# Patient Record
Sex: Female | Born: 1960 | Race: White | Hispanic: No | Marital: Married
Health system: Midwestern US, Community
[De-identification: ages and names within clinical notes are randomized; demographics above are authoritative.]

## PROBLEM LIST (undated history)

## (undated) DIAGNOSIS — R1032 Left lower quadrant pain: Secondary | ICD-10-CM

## (undated) DIAGNOSIS — Z1231 Encounter for screening mammogram for malignant neoplasm of breast: Secondary | ICD-10-CM

## (undated) HISTORY — PX: TOTAL VAGINAL HYSTERECTOMY: SHX2548

## (undated) HISTORY — PX: TONSILLECTOMY: SUR1361

## (undated) HISTORY — PX: VEIN LIGATION AND STRIPPING: SHX2653

## (undated) HISTORY — PX: HERNIA REPAIR: SHX51

---

## 2001-07-18 ENCOUNTER — Other Ambulatory Visit: Admission: RE | Admit: 2001-07-18 | Discharge: 2001-07-18 | Payer: Self-pay | Admitting: Gynecology

## 2002-10-16 ENCOUNTER — Other Ambulatory Visit: Admission: RE | Admit: 2002-10-16 | Discharge: 2002-10-16 | Payer: Self-pay | Admitting: Gynecology

## 2002-11-16 ENCOUNTER — Encounter: Payer: Self-pay | Admitting: Gynecology

## 2002-11-16 ENCOUNTER — Encounter: Admission: RE | Admit: 2002-11-16 | Discharge: 2002-11-16 | Payer: Self-pay | Admitting: Gynecology

## 2002-11-30 ENCOUNTER — Encounter: Admission: RE | Admit: 2002-11-30 | Discharge: 2002-11-30 | Payer: Self-pay | Admitting: Gynecology

## 2002-11-30 ENCOUNTER — Encounter: Payer: Self-pay | Admitting: Gynecology

## 2003-10-30 ENCOUNTER — Encounter: Admission: RE | Admit: 2003-10-30 | Discharge: 2003-10-30 | Payer: Self-pay | Admitting: Gynecology

## 2004-01-01 ENCOUNTER — Other Ambulatory Visit: Admission: RE | Admit: 2004-01-01 | Discharge: 2004-01-01 | Payer: Self-pay | Admitting: Gynecology

## 2004-11-13 ENCOUNTER — Encounter: Admission: RE | Admit: 2004-11-13 | Discharge: 2004-11-13 | Payer: Self-pay | Admitting: Gynecology

## 2005-01-01 ENCOUNTER — Other Ambulatory Visit: Admission: RE | Admit: 2005-01-01 | Discharge: 2005-01-01 | Payer: Self-pay | Admitting: Gynecology

## 2005-12-01 ENCOUNTER — Ambulatory Visit (HOSPITAL_COMMUNITY): Admission: RE | Admit: 2005-12-01 | Discharge: 2005-12-02 | Payer: Self-pay | Admitting: Obstetrics & Gynecology

## 2005-12-01 ENCOUNTER — Encounter (INDEPENDENT_AMBULATORY_CARE_PROVIDER_SITE_OTHER): Payer: Self-pay | Admitting: *Deleted

## 2006-01-07 ENCOUNTER — Encounter: Admission: RE | Admit: 2006-01-07 | Discharge: 2006-01-07 | Payer: Self-pay | Admitting: Obstetrics & Gynecology

## 2006-02-13 ENCOUNTER — Inpatient Hospital Stay (HOSPITAL_COMMUNITY): Admission: AD | Admit: 2006-02-13 | Discharge: 2006-02-13 | Payer: Self-pay | Admitting: Obstetrics and Gynecology

## 2006-02-19 ENCOUNTER — Ambulatory Visit (HOSPITAL_COMMUNITY): Admission: RE | Admit: 2006-02-19 | Discharge: 2006-02-19 | Payer: Self-pay | Admitting: Obstetrics & Gynecology

## 2006-03-25 ENCOUNTER — Ambulatory Visit: Payer: Self-pay | Admitting: Internal Medicine

## 2006-03-25 LAB — CONVERTED CEMR LAB
ALT: 22 units/L (ref 0–40)
AST: 23 units/L (ref 0–37)
Albumin: 4.1 g/dL (ref 3.5–5.2)
Alkaline Phosphatase: 50 units/L (ref 39–117)
BUN: 13 mg/dL (ref 6–23)
Basophils Absolute: 0 10*3/uL (ref 0.0–0.1)
Basophils Relative: 0 % (ref 0.0–1.0)
CO2: 31 meq/L (ref 19–32)
Calcium: 9.5 mg/dL (ref 8.4–10.5)
Chloride: 103 meq/L (ref 96–112)
Chol/HDL Ratio, serum: 2.5
Cholesterol: 183 mg/dL (ref 0–200)
Creatinine, Ser: 1 mg/dL (ref 0.4–1.2)
Eosinophil percent: 1 % (ref 0.0–5.0)
GFR calc non Af Amer: 64 mL/min
Glomerular Filtration Rate, Af Am: 78 mL/min/{1.73_m2}
Glucose, Bld: 99 mg/dL (ref 70–99)
HCT: 39.6 % (ref 36.0–46.0)
HDL: 72.4 mg/dL (ref >39.0)
Hemoglobin: 13.4 g/dL (ref 12.0–15.0)
LDL Cholesterol: 97 mg/dL (ref 0–99)
Lymphocytes Relative: 29.1 % (ref 12.0–46.0)
MCHC: 33.7 g/dL (ref 30.0–36.0)
MCV: 93.7 fL (ref 78.0–100.0)
Monocytes Absolute: 0.5 10*3/uL (ref 0.2–0.7)
Monocytes Relative: 9.6 % (ref 3.0–11.0)
Neutro Abs: 3 10*3/uL (ref 1.4–7.7)
Neutrophils Relative %: 60.3 % (ref 43.0–77.0)
Platelets: 185 10*3/uL (ref 150–400)
Potassium: 4.3 meq/L (ref 3.5–5.1)
RBC: 4.23 M/uL (ref 3.87–5.11)
RDW: 12 % (ref 11.5–14.6)
Sodium: 142 meq/L (ref 135–145)
TSH: 0.98 microintl units/mL (ref 0.35–5.50)
Total Bilirubin: 1.1 mg/dL (ref 0.3–1.2)
Total Protein: 7.5 g/dL (ref 6.0–8.3)
Triglyceride fasting, serum: 66 mg/dL (ref 0–149)
VLDL: 13 mg/dL (ref 0–40)
WBC: 4.9 10*3/uL (ref 4.5–10.5)

## 2006-04-01 ENCOUNTER — Ambulatory Visit: Payer: Self-pay | Admitting: Internal Medicine

## 2006-04-06 ENCOUNTER — Encounter: Admission: RE | Admit: 2006-04-06 | Discharge: 2006-04-06 | Payer: Self-pay | Admitting: Obstetrics & Gynecology

## 2006-05-19 ENCOUNTER — Ambulatory Visit: Payer: Self-pay | Admitting: Internal Medicine

## 2007-02-07 ENCOUNTER — Encounter: Admission: RE | Admit: 2007-02-07 | Discharge: 2007-02-07 | Payer: Self-pay | Admitting: Obstetrics & Gynecology

## 2007-03-17 LAB — CONVERTED CEMR LAB: Pap Smear: NORMAL

## 2007-06-01 ENCOUNTER — Encounter: Payer: Self-pay | Admitting: Internal Medicine

## 2007-12-09 ENCOUNTER — Telehealth: Payer: Self-pay | Admitting: Internal Medicine

## 2007-12-26 ENCOUNTER — Ambulatory Visit: Payer: Self-pay | Admitting: Internal Medicine

## 2007-12-26 LAB — CONVERTED CEMR LAB
ALT: 13 units/L (ref 0–35)
AST: 21 units/L (ref 0–37)
Albumin: 4 g/dL (ref 3.5–5.2)
Alkaline Phosphatase: 51 units/L (ref 39–117)
BUN: 15 mg/dL (ref 6–23)
Bilirubin, Direct: 0.1 mg/dL (ref 0.0–0.3)
CO2: 29 meq/L (ref 19–32)
Eosinophils Relative: 1.4 % (ref 0.0–5.0)
GFR calc Af Amer: 87 mL/min
Glucose, Bld: 97 mg/dL (ref 70–99)
HCT: 39.8 % (ref 36.0–46.0)
HDL: 67.8 mg/dL (ref >39.0)
Hemoglobin: 14.1 g/dL (ref 12.0–15.0)
LDL Cholesterol: 112 mg/dL — ABNORMAL HIGH (ref 0–99)
Lymphocytes Relative: 35.8 % (ref 12.0–46.0)
Monocytes Absolute: 0.5 10*3/uL (ref 0.1–1.0)
Monocytes Relative: 10.6 % (ref 3.0–12.0)
Nitrite: NEGATIVE
Platelets: 173 10*3/uL (ref 150–400)
Potassium: 4.5 meq/L (ref 3.5–5.1)
Sodium: 144 meq/L (ref 135–145)
Specific Gravity, Urine: >=1.03
Total CHOL/HDL Ratio: 2.8
Total Protein: 7.4 g/dL (ref 6.0–8.3)
Urobilinogen, UA: 0.2
WBC Urine, dipstick: NEGATIVE
WBC: 4.8 10*3/uL (ref 4.5–10.5)

## 2008-01-18 ENCOUNTER — Ambulatory Visit: Payer: Self-pay | Admitting: Internal Medicine

## 2008-01-18 DIAGNOSIS — Z780 Asymptomatic menopausal state: Secondary | ICD-10-CM | POA: Insufficient documentation

## 2008-02-28 ENCOUNTER — Encounter: Admission: RE | Admit: 2008-02-28 | Discharge: 2008-02-28 | Payer: Self-pay | Admitting: Obstetrics & Gynecology

## 2008-07-12 IMAGING — CT CT ABDOMEN W/ CM
2 of 5 series · 17 of 46 positions shown, 19 images · IV contrast (READICAT/WATER & [ID] OMNI 300)
Comparison: none

CLINICAL DATA: Pain primarily in the pelvis. 
 CT ABDOMEN AND PELVIS WITH CONTRAST:
TECHNIQUE: Multidetector CT imaging of the abdomen and pelvis was performed following the standard protocol during bolus administration of intravenous contrast.
 Contrast:  833cc Omnipaque 300.
 CT ABDOMEN WITH CONTRAST:

[Series 3: routine abdomen · axial · 0.70mm/px · z∈[-396,-51]mm · 14 of 77 slices shown, 16 images]
[im 5/77  soft-tissue]
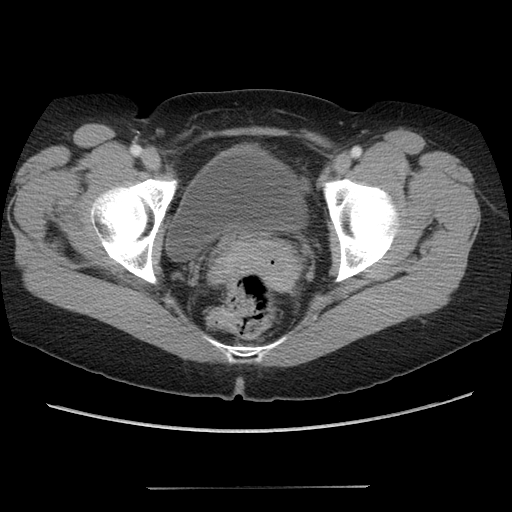
[im 5/77  bone]
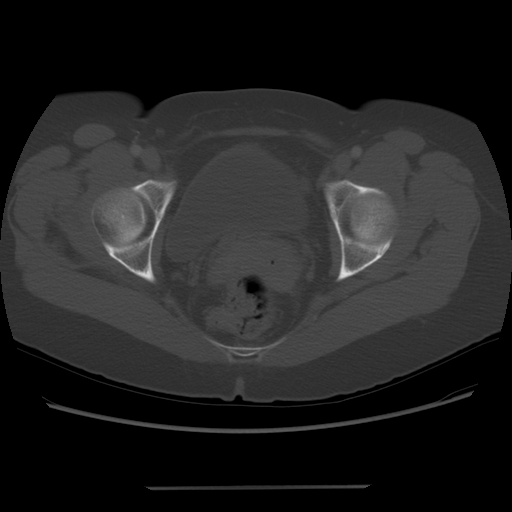
[im 9/77  soft-tissue]
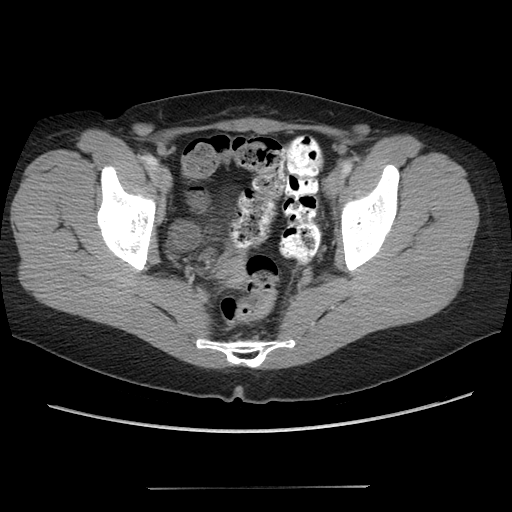
[im 17/77  soft-tissue]
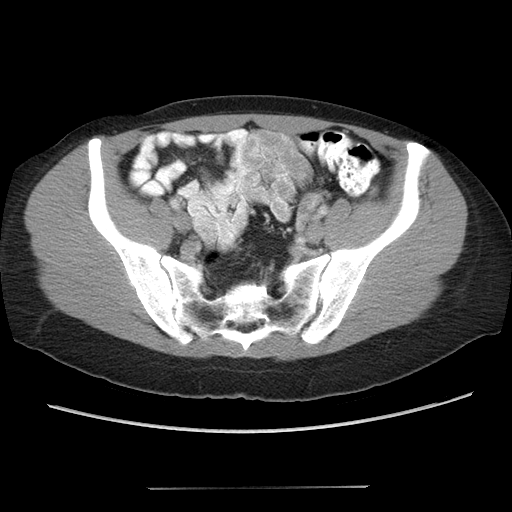
[im 21/77  soft-tissue]
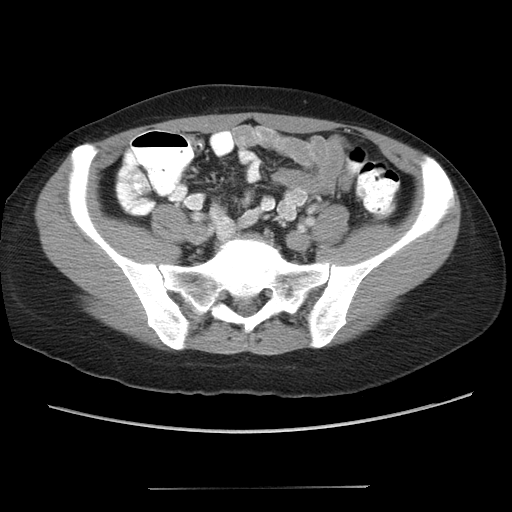
[im 25/77  soft-tissue]
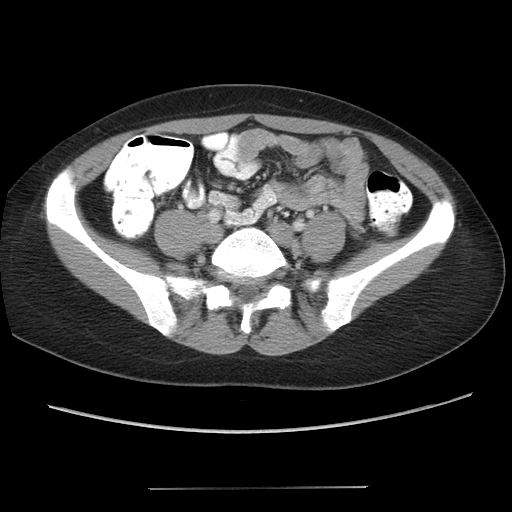
[im 33/77  soft-tissue]
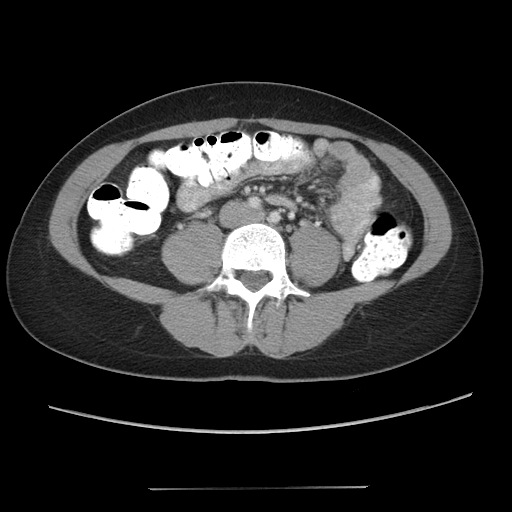
[im 37/77  soft-tissue]
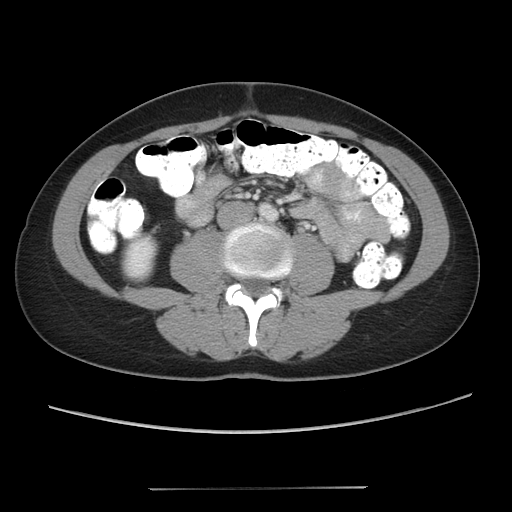
[im 41/77  soft-tissue]
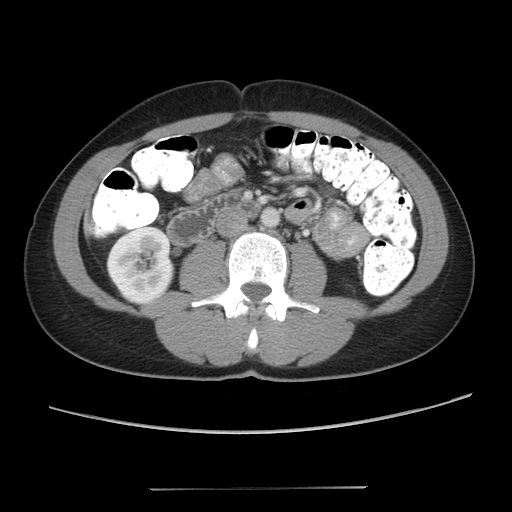
[im 45/77  soft-tissue]
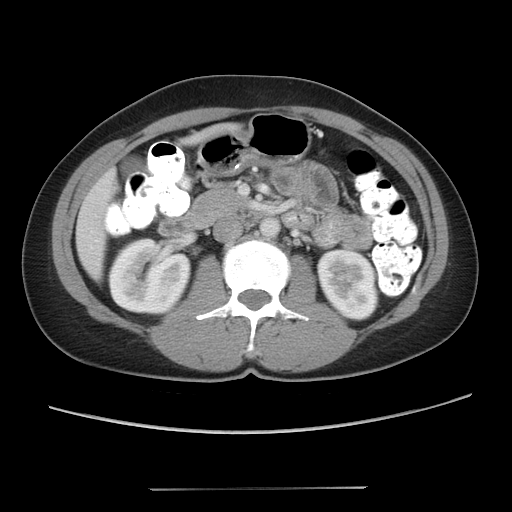
[im 45/77  bone]
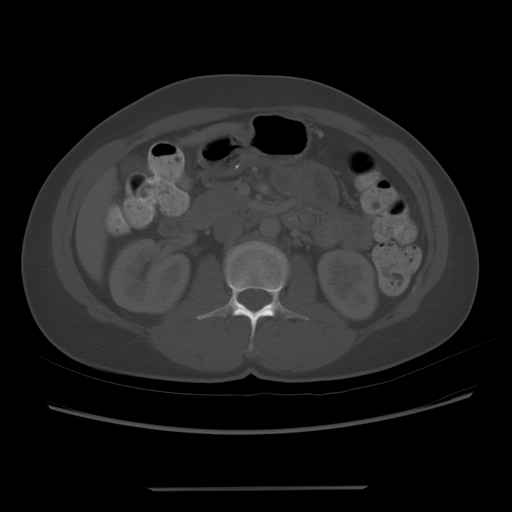
[im 53/77  soft-tissue]
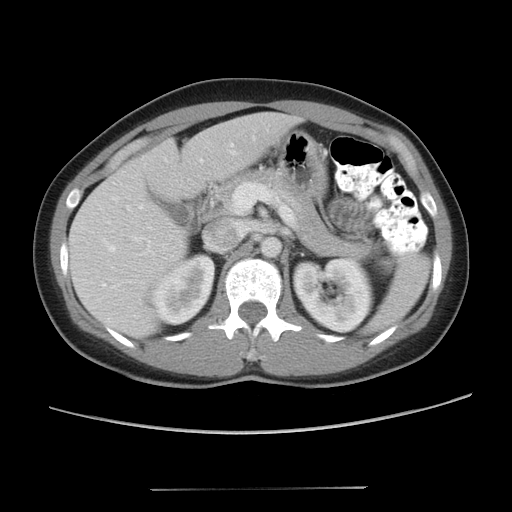
[im 57/77  soft-tissue]
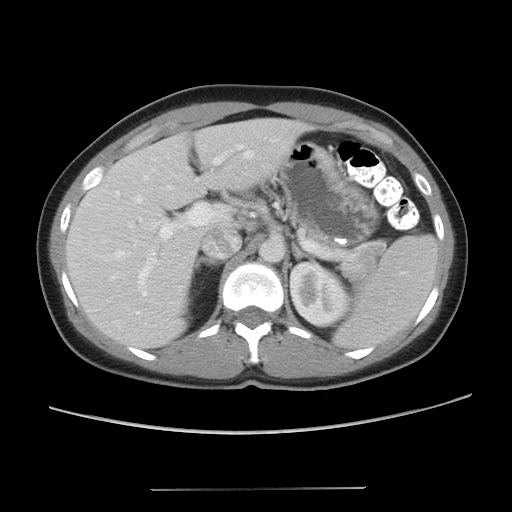
[im 61/77  soft-tissue]
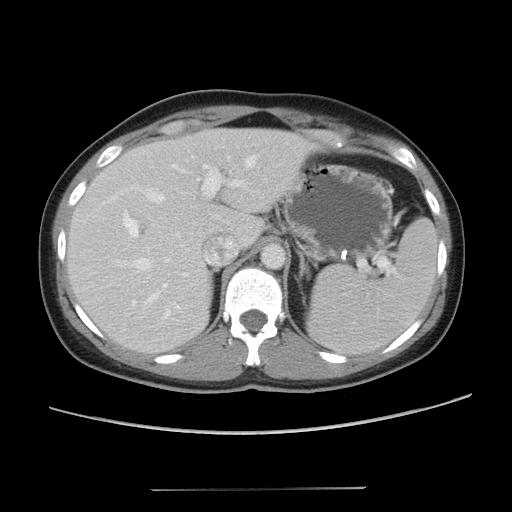
[im 69/77  soft-tissue]
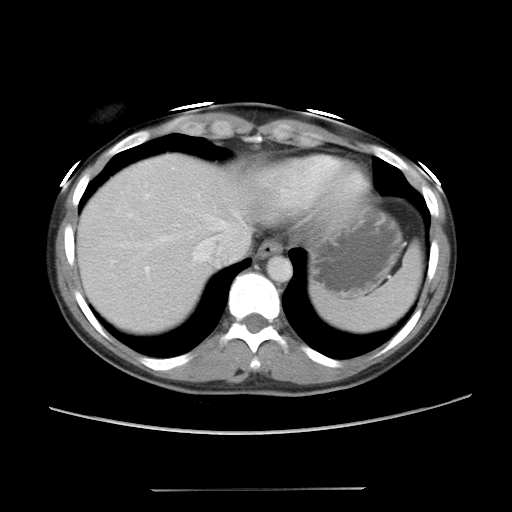
[im 73/77  soft-tissue]
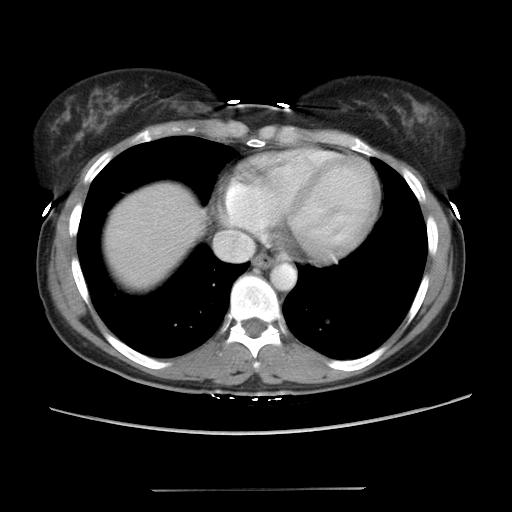

[Series 602: sagittal body · sagittal · 0.87mm/px · 3 of 145 slices shown]
[im 49/145  soft-tissue]
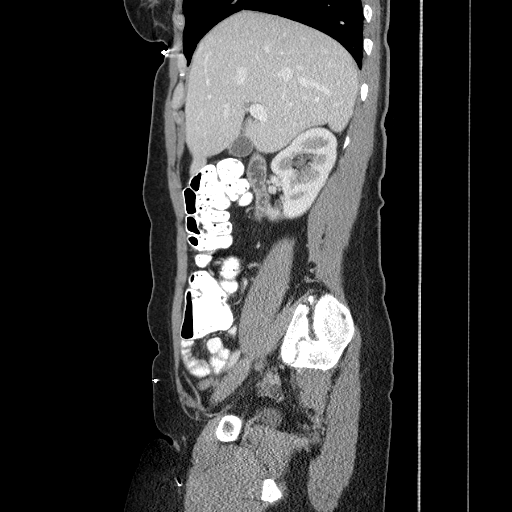
[im 65/145  soft-tissue]
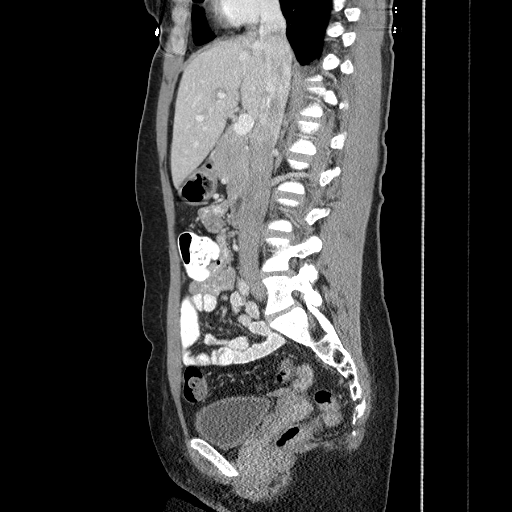
[im 81/145  soft-tissue]
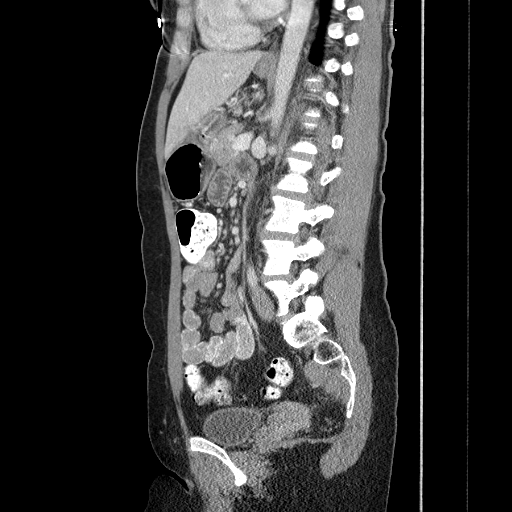

[17 of 46 positions shown; findings below may reference images not displayed]

FINDINGS: The lung bases are clear.  The liver enhances with no focal abnormality and no ductal dilatation is seen.  No calcified gallstones are noted.  The pancreas appears normal as are the adrenal glands and the spleen.  The kidneys enhance and on delayed images the pelvocaliceal systems appear normal.  The abdominal aorta is normal in caliber.
IMPRESSION: Negative CT of the abdomen. 
 CT PELVIS WITH CONTRAST:
FINDINGS: The appendix is well seen and appears normal as does the terminal ileum.  The urinary bladder is not well distended but no gross abnormality is seen.  No pelvic mass is seen and no fluid is noted within the pelvis.  Low attenuation area in the left adnexa is most consistent with a left ovarian cyst of approximately 32 x 23mm.  Small right ovarian follicles are present.  There are a few minimally prominent groin nodes present.  Surgical clips are present in the right groin.
IMPRESSION: 1.  No acute abnormality on CT of the pelvis.  Bilateral ovarian cysts are noted left slightly larger than right. 
 2.  Appendix and terminal ileum appear normal. 
 3.  Minimally prominent groin nodes.

## 2009-03-04 ENCOUNTER — Encounter: Admission: RE | Admit: 2009-03-04 | Discharge: 2009-03-04 | Payer: Self-pay | Admitting: Obstetrics & Gynecology

## 2010-08-01 NOTE — Discharge Summary (Signed)
Melinda Bradshaw, Melinda Bradshaw               ACCOUNT NO.:  [id number]   MEDICAL RECORD NO.:  [id number]          PATIENT TYPE:  OIB   LOCATION:  9302                          FACILITY:  [facility]   PHYSICIAN:  M. Leda Quail, MD  DATE OF BIRTH:  01-29-1961   DATE OF ADMISSION:  12/01/2005  DATE OF DISCHARGE:  12/02/2005                                 DISCHARGE SUMMARY   ADMISSION DIAGNOSES:  10. A 50 year old G4, P2 married white female with fibroid uterus.  2. Spontaneous vaginal delivery x2.  3. Pelvic pressure and bladder pressure.   DISCHARGE DIAGNOSES:  66. A 50 year old G3, P2 married white female with fibroid uterus.  2. Spontaneous vaginal delivery x2.  3. Pelvic pressure and bladder pressure.   PROCEDURES:  Laparoscopic-assisted vaginal hysterectomy.   HOSPITAL COURSE:  Written H&P is in the chart.  [name] is a very  pleasant 50 year old G66, P2 married white female with symptomatic fibroid  uterus who has opted for definitive treatment with LAVH who is admitted to  same day surgery and taken to the respiratory rate where the surgery was  performed without difficulty.  She has 50 cc of blood loss.  She made 100 cc  of clear urine during the procedure.  After appropriate time in the recovery  room, she was taken to the third floor for postop course.  In the evening of  postoperative day #0, she was doing well, vital signs were stable and she  was afebrile.  Her Foley catheter was removed and she was able to void  without difficulty.  She had regular diet in the evening of postoperative  day #0.  In the morning of postop day #1, she was able to advance to a  regular diet.  She was switched to oral pain medications and did well with  Percocet.  She was able to ambulate and shower without difficulty.  Her  postop hemoglobin was 11.8, white count 8.2, platelets 173.  She was doing  well.  Discharge was felt appropriate.   Pathology showed mild chronic cervicitis, adenomyosis, benign  leiomas with  largest measuring 5 cm, benign proliferative endometrium.      Lum Keas, MD  Electronically Signed     MSM/MEDQ  D:  12/25/2005  T:  12/28/2005  Job:  [id number]

## 2010-08-01 NOTE — Op Note (Signed)
NAMEREMA, LIEVANOS               ACCOUNT NO.:  [id number]   MEDICAL RECORD NO.:  [id number]          PATIENT TYPE:  OIB   LOCATION:  9302                          FACILITY:  [facility]   PHYSICIAN:  M. Leda Quail, MD  DATE OF BIRTH:  [birth year]   DATE OF PROCEDURE:  12/01/2005  DATE OF DISCHARGE:                                 OPERATIVE REPORT   PREOPERATIVE DIAGNOSIS:  1. A 50 year old G1, P2 married white female seen today for fibroid      uterus.  2. History of NSVD x2.   POSTOPERATIVE DIAGNOSIS:  1. A 50 year old G12, P2 married white female seen today for fibroid      uterus.  2. History of NSVD x2.   PROCEDURES:  LAVH.   SURGEON:  M. Leda Quail, MD.   ASSISTANT:  Meredeth Ide, M.D. .   ANESTHESIA:  General endotracheal.   SPECIMENS:  Uterus and cervix sent to pathology.   ESTIMATED BLOOD LOSS:  50 cc.   URINE OUTPUT:  100 cc of clear urine during, at the beginning and at the end  of the procedure.   FLUIDS:  2200 cc of LR.   COMPLICATIONS:  None.   INDICATIONS:  This is a 50 year old married white female with a history of  an enlarged uterus approximately 12 weeks in size.  She has had multiple  fibroids on ultrasound.  She has a significant amount of pain and  discomfort, even though she does have relatively good bleeding control while  using a NuvaRing.  She has considered options and is desires definitive  treatment.   OPERATIVE PROCEDURE:  The patient was taken to the operating room.  She was  placed in supine position.  She underwent endotracheal anesthesia  administered by Anesthesia staff.  Running IV is in place and informed  consent was present on the chart.  The patient's legs are positioned in the  [place] stirrups in the lower body position.  Abdomen, peroneum, inner thighs  and vagina were prepped in normal sterile fashion.  Foley catheter was  inserted and the bladder was drained of all urine.  Catheterization was then  removed.  Speculum  was placed in the vagina, and the cervix was well  visualized.  A single toothed tenaculum was placed on the anterior lip of  the cervix and an [place] uterine manipulator was placed in the cervical os as  a means to manipulate the uterus during the procedure.  The speculum was  removed from the vagina, and the abdomen and peroneum were draped in a  normal sterile fashion.   A 10 mm skin incision was made with a knife beneath the umbilicus.  Subcutaneous fat tissue was dissected.  The abdominal wall was elevated, and  using Veress needles aiming towards the pelvis, the abdominal wall areas  were traversed.  The peritoneum was popped through.  Syringe and normal  saline was used to aspirate, inject and then aspirate again.  No bleeding or  other fluid was noted.  A drip test showed fluid dripping equally into the  abdomen.  A CO2 gas  was attached to the Veress needle at low flow.  Pressures were low and the pneumoperitoneum was achieved without difficulty.  Once 2-1/2 liters of CO2 gas were in the abdomen, Veress needle was removed.  A 5 mm bladed trocar port were placed in the midline with elevation of the  abdomen.  This was aimed towards the pelvis again, and the abdominal wall  areas were traversed easily.  A 5 mm scope was used to survey the pelvis and  upper abdomen.  Photic documentation was made.  The ovaries appeared normal  bilaterally.  There was a small adhesion on the right ovary to the pelvic  sidewall.  There are multiple fibroids present, particularly over on the  left side of the uterus where there is a large fibroid that is pressing onto  the sidewall.  The ureters were noted bilaterally.  The upper abdomen was  surveyed.  The liver edge, gallbladder and stomach edge appeared normal.  The appendix was searched for, but we could not find it.   The right and left lower quadrants were transilluminated and sights for  right and left lower quadrant ports were chosen.  Marcaine  40% was used to  inject the skin, and then 5 mm skin incisions were made with the knife.  The  5 mm bladed trocars were placed under direct visualization intra-  abdominally.  These were placed within difficulty.   Then, using endoscopic grasper, the right corner of the uterus was grasped.  A Gyrus was used to cauterize and then traverse uterine ovarian pedicle on  the right.  Sound indicator was used to insure that excellent hemostasis of  the pedicle was achieved.  The uterus was put on stretch to take the Gyrus  as far away from the pelvic sidewall as possible.  On the right side of the  uterine, ovarian ligament was cauterized and traversed, and a round ligament  on the right was cauterized and traversed in a similar fashion.  The  beginning of the broad ligament on the right side was also cauterized and  incised.  Then in a similar fashion on the left side of the uterus,  endoscopic grasper was used to put the uterus on stretch.  The round  ligament was cauterized and incised on the left side.  A uterine ovarian  pedicle was cauterized serially and sized again, keeping the Gyrus as far  away from the pelvic sidewall as possible.  The ureter was noted several  times to be peristaltic.  It's location was far below the level of  dissection.  At this point, decision was made to go ahead below with the  procedure.  Legs were positioned in the high lithotomy position.   A weighted speculum was placed in the vagina.  Posterior cul-de-sac was  entered sharply.  A figure-of-8 suture was placed across the posterior  peritoneum and the posterior vaginal mucosa.  The cervix was then  circumscribed with cautery.  The uterosacral ligaments bilaterally were  clamped, transected and sutured with 8 sutures of 0-Vicryl.  Attention was  then turned anteriorly on the uterus and tubo-vesicocervical fascia was  incised with curved Mayo scissors.  Using blunt dissection, bladder flap was created, pushing  the bladder anteriorly, superiorly and out of the way of  surgical dissection.  The point at cardinal ligaments were serially clamped,  transected and suture ligated with 0-Vicryl.  Further dissection of the tubo-  vesicocervical fascia was performed as necessary.  The uterine arteries  were  clamped bilaterally, transected and suture ligated with stitch of 0-Vicryl.  The uterine and artery pedicles were tied twice with stitches of 0-Vicryl  with excellent hemostasis.  At this point, the anterior peritoneum was  identified and was tied sharply.  A curved Avery retractor was placed into  the incision, and bladder was elevated.  At this point, two more clamps of  the broad ligament on the right and left side were placed, transected and  suture ligated with 0-Vicryl.  At this point, the posterior aspect of the  fundus was brought into the vagina.  There were several fibroids that were  present.  The uterus was morcellated and brought out in pieces because of  the large fibroids.  The fibroids were excised as well.  Once the uterus  could be completely flipped, a small amount of tissue remained and Haney  clamps were placed across this tissue bilaterally.  These pedicles were  transected and suture ligated with three times 0-Vicryl.  These sutures were  cut short.  There was a small amount of bleeding on the left side in the  region of the broad ligament.  Allis clamp was placed across the region of  the bleeding and a superficial figure-of-8 stitch and 0-Vicryl was placed.  Excellent hemostasis was present at this point.  Cuff was surveyed, no  bleeding was noted, and a moist laparotomy tape was placed in the incision,  pushing bowel out of the way.  At this point, the cuff was run with a  pledget 0-Vicryl.  Starting at 2 o'clock and incorporating the anterior  peritoneum, a running interlocking stitch was brought around the vaginal  cuff, incorporating the anterior peritoneum and the posterior  peritoneum in  the stitch.  The stitch ended at approximately 10 o'clock on the cervix.  At  this point, excellent hemostasis present.  A uterosacral ligament stitch was  placed and brought the posterior vaginal mucosa and incorporating the medial  third of the left uterosacral ligament, reaching over the posterior vaginal  peritoneum and then incorporating the medial third of the right uterosacral  ligament.  The stitches had been brought out of the posterior vaginal mucosa  and the stitch was tied tight.  The vagina was lifted very nicely.  At this  point, three figure-of-8 sutures of 2-0 Vicryl Rapide were placed to close  vaginal cuff.  A Foley catheter was placed in the bladder.  The legs were  then placed in the low lithotomy position.  Attention was turned back to the  abdomen.  The pneumoperitoneum was achieved again, and CO2 gas and  laparoscope used to survey the vaginal cuff.  Excellent hemostasis along the vaginal cuff.  None of the ureters were noted bilaterally with normal  peristalsis.  There was some small mild bleeding at the left IP ligament.  Using the Gyrus, this was made hemostatic.  Again, attempt was made to find  the appendix which was unsuccessful.  At this point, all pedicles were  hemostatic.  The pneumoperitoneum was relieved.  The left lower quadrant  ports were removed under direct visualization of the laparoscope and no  bleeding was noted.  Once the midline port and laparoscope were removed.  The operator's index finger was used to insure there was no bowel in the  incision.  The anesthesiologist gave several breaths of oxygen and the  pneumoperitoneum was relieved by the operating assistant pressing on the  abdomen to allow for as much as to  escape as possible.   The incisions were cleansed.  Dermabond was applied to each incision x ii.  Dressings were placed across the incisions.   Sponge, needle and instrument counts were correct x2.  The patient  tolerated  the procedure very well and she was taken to the recovery room in stable  condition.      Lum Keas, MD  Electronically Signed     MSM/MEDQ  D:  12/01/2005  T:  12/02/2005  Job:  [id number]

## 2011-12-01 ENCOUNTER — Other Ambulatory Visit: Payer: Self-pay | Admitting: Internal Medicine

## 2011-12-01 DIAGNOSIS — R1905 Periumbilic swelling, mass or lump: Secondary | ICD-10-CM

## 2011-12-04 ENCOUNTER — Ambulatory Visit
Admission: RE | Admit: 2011-12-04 | Discharge: 2011-12-04 | Disposition: A | Discharge status: Home / Self Care | Payer: BC Managed Care – PPO | Source: Ambulatory Visit | Attending: Internal Medicine | Admitting: Internal Medicine

## 2011-12-04 DIAGNOSIS — R1905 Periumbilic swelling, mass or lump: Secondary | ICD-10-CM

## 2012-05-28 ENCOUNTER — Emergency Department (HOSPITAL_COMMUNITY)
Admission: EM | Admit: 2012-05-28 | Discharge: 2012-05-29 | Disposition: A | Discharge status: Home / Self Care | Payer: BC Managed Care – PPO | Attending: Emergency Medicine | Admitting: Emergency Medicine

## 2012-05-28 DIAGNOSIS — M7022 Olecranon bursitis, left elbow: Secondary | ICD-10-CM

## 2012-05-28 DIAGNOSIS — M25439 Effusion, unspecified wrist: Secondary | ICD-10-CM | POA: Insufficient documentation

## 2012-05-28 DIAGNOSIS — M7020 Olecranon bursitis, unspecified elbow: Secondary | ICD-10-CM | POA: Insufficient documentation

## 2012-05-29 ENCOUNTER — Encounter (HOSPITAL_COMMUNITY): Payer: Self-pay

## 2012-05-29 MED ORDER — IBUPROFEN 800 MG PO TABS: 800.0000 mg | ORAL_TABLET | Freq: Three times a day (TID) | ORAL | Status: AC

## 2012-05-29 NOTE — ED Notes (Signed)
Per pt, suddenly today noted pain in left elbow.  Pt with swelling and redness to left elbow.  Attempted ice at home.  No pain meds at home.

## 2012-05-29 NOTE — ED Provider Notes (Signed)
History    This chart was scribed for non-physician practitioner working with [name], MD by Frederik Pear, ED Scribe. This patient was seen in room WTR7/WTR7 and the patient's care was started at 2355.   CSN: [id number]  Arrival date & time 05/28/12  2346   First MD Initiated Contact with Patient 05/28/12 2355      Chief Complaint  Patient presents with  . Elbow Pain    (Consider location/radiation/quality/duration/timing/severity/associated sxs/prior treatment) Patient is a 52 y.o. female presenting with extremity pain. The history is provided by the patient. No language interpreter was used.  Extremity Pain This is a new problem. The current episode started 6 to 12 hours ago. The problem occurs constantly. The problem has been gradually worsening. She has tried a cold compress for the symptoms.   Melinda Bradshaw is a 52 y.o. female who presents to the Emergency Department complaining of sudden onset, 9/10, constant, gradually worsening left elbow pain with associated swelling and redness that began at 1500. She denies any trauma to the area. She states that she spends a lot of time typing at her desk, which is L-shaped, and her left arm is often placed on her desk while she is typing. She denies having a wrist guard on the desk. She denies any fever or chills. Denies numbness or tingling.  She states that she is right handed. She reports that she treated the symptoms with ice at home.  History reviewed. No pertinent past medical history.  Past Surgical History  Procedure Laterality Date  . Hernia repair    . Total vaginal hysterectomy    . Tonsillectomy    . Vein ligation and stripping      History reviewed. No pertinent family history.  History  Substance Use Topics  . Smoking status: Never Smoker   . Smokeless tobacco: Not on file  . Alcohol Use: No    OB History   Grav Para Term Preterm Abortions TAB SAB Ect Mult Living                  Review of Systems   Musculoskeletal: Positive for joint swelling and arthralgias.  All other systems reviewed and are negative.    Allergies  Review of patient's allergies indicates not on file.  Home Medications  No current outpatient prescriptions on file.  BP 135/73  Pulse 75  Temp(Src) 98.9 F (37.2 C)  Resp 18  SpO2 100%  Physical Exam  Nursing note and vitals reviewed. Constitutional: She appears well-developed and well-nourished.  HENT:  Head: Normocephalic and atraumatic.  Mouth/Throat: Oropharynx is clear and moist.  Eyes: EOM are normal. Pupils are equal, round, and reactive to light.  Neck: Normal range of motion. Neck supple.  Cardiovascular: Normal rate, regular rhythm and normal heart sounds.   Good radial pulses.  Pulmonary/Chest: Effort normal and breath sounds normal. She has no wheezes.  Musculoskeletal: Normal range of motion. She exhibits tenderness.  Mild erythema and swelling over the are of the olecreon bursa. There is good ROM of the elbow. ROM of the wrist and shoulder are intact. Full extension of the elbow, but flexion is mildly limited secondary to pain.  Neurological: She is alert.  Sensation is intact.   Skin: Skin is warm and dry.  Psychiatric: She has a normal mood and affect. Her behavior is normal.    ED Course  Procedures (including critical care time)  DIAGNOSTIC STUDIES: Oxygen Saturation is 100% on room air, normal by my  interpretation.    COORDINATION OF CARE:  00:15- Discussed planned course of treatment with the patient, including treating the symptoms with antiinflammatories, who is agreeable at this time.  Labs Reviewed - No data to display No results found.   No diagnosis found.    MDM  Physical exam consistent with Olecranon Bursitis.  Patient afebrile with full ROM of the elbow.  Therefore, doubt septic joint.  Patient instructed to use NSAIDs.  I personally performed the services described in this documentation, which was scribed  in my presence. The recorded information has been reviewed and is accurate.        Pascal Lux [place], PA-C 05/29/12 [phone]

## 2012-05-30 NOTE — ED Provider Notes (Signed)
Medical screening examination/treatment/procedure(s) were conducted as a shared visit with non-physician practitioner(s) and myself.  I personally evaluated the patient during the encounter  [name], MD 05/30/12 [phone]

## 2013-03-31 ENCOUNTER — Other Ambulatory Visit: Payer: Self-pay

## 2013-03-31 DIAGNOSIS — Z1231 Encounter for screening mammogram for malignant neoplasm of breast: Secondary | ICD-10-CM

## 2013-04-07 ENCOUNTER — Ambulatory Visit
Admission: RE | Admit: 2013-04-07 | Discharge: 2013-04-07 | Disposition: A | Discharge status: Home / Self Care | Payer: No Typology Code available for payment source | Source: Ambulatory Visit

## 2013-04-07 DIAGNOSIS — Z1231 Encounter for screening mammogram for malignant neoplasm of breast: Secondary | ICD-10-CM

## 2013-04-13 ENCOUNTER — Other Ambulatory Visit: Payer: Self-pay | Admitting: Obstetrics & Gynecology

## 2013-04-13 DIAGNOSIS — R928 Other abnormal and inconclusive findings on diagnostic imaging of breast: Secondary | ICD-10-CM

## 2013-04-19 ENCOUNTER — Ambulatory Visit
Admission: RE | Admit: 2013-04-19 | Discharge: 2013-04-19 | Disposition: A | Discharge status: Home / Self Care | Payer: No Typology Code available for payment source | Source: Ambulatory Visit | Attending: Obstetrics & Gynecology | Admitting: Obstetrics & Gynecology

## 2013-04-19 DIAGNOSIS — R928 Other abnormal and inconclusive findings on diagnostic imaging of breast: Secondary | ICD-10-CM

## 2014-03-11 IMAGING — US US TRANSVAGINAL NON-OB
1 series · 12 of 12 positions shown · non-contrast
Comparison: CT abdomen pelvis of 04/06/2006

CLINICAL DATA: Abdominal and pelvic fullness and swelling, prior
hysterectomy

TRANSABDOMINAL AND TRANSVAGINAL ULTRASOUND OF PELVIS
TECHNIQUE: Both transabdominal and transvaginal ultrasound
examinations of the pelvis were performed including evaluation of
the uterus, ovaries, adnexal regions, and pelvic cul-de-sac.

[Series 1: us transvaginal non-ob · 12 acquisitions, 12 frames shown]
[im 1/12]
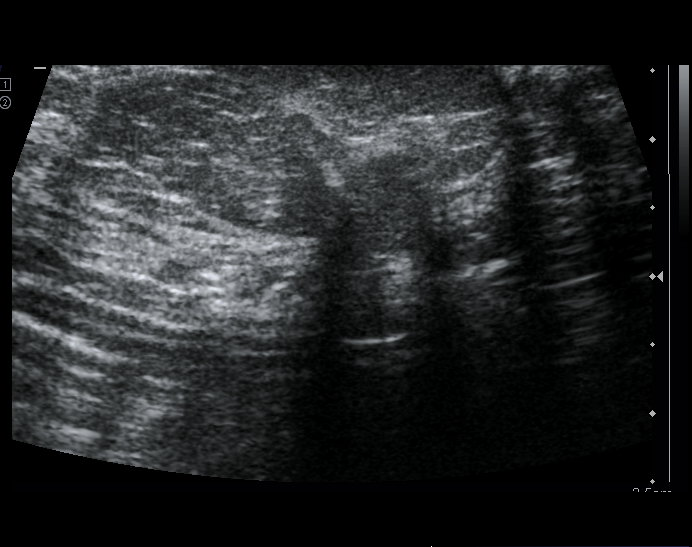
[im 2/12]
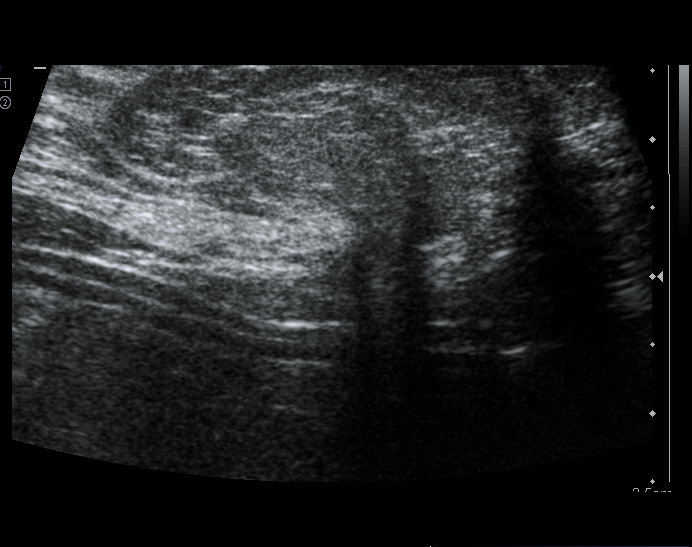
[im 3/12]
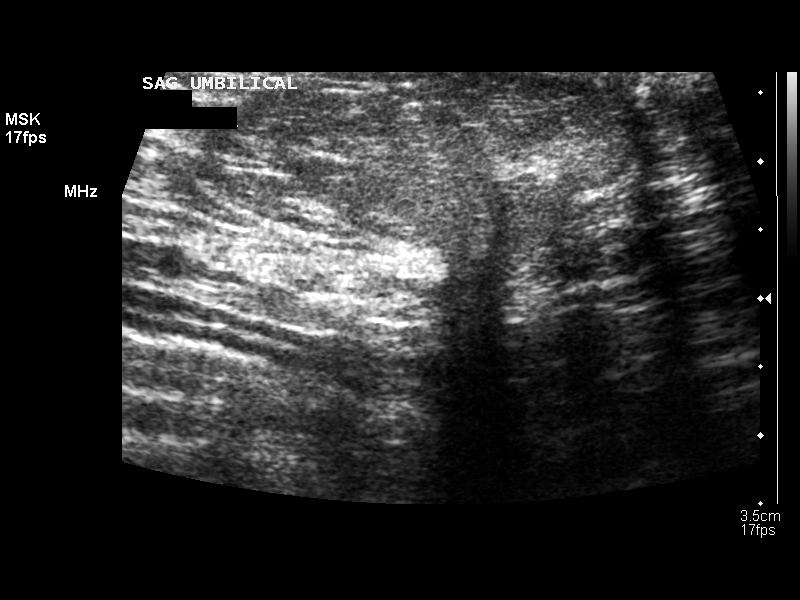
[im 4/12]
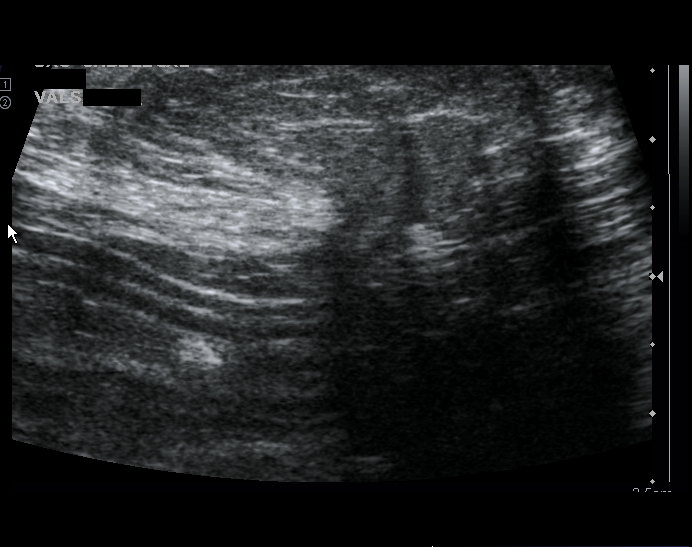
[im 5/12]
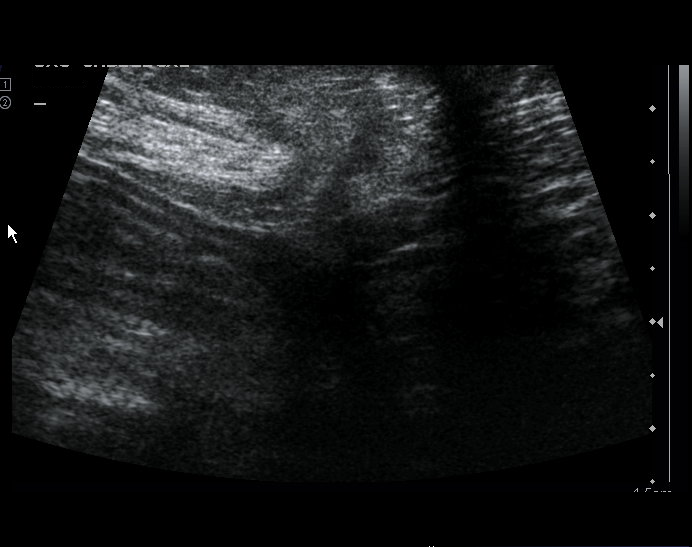
[im 6/12]
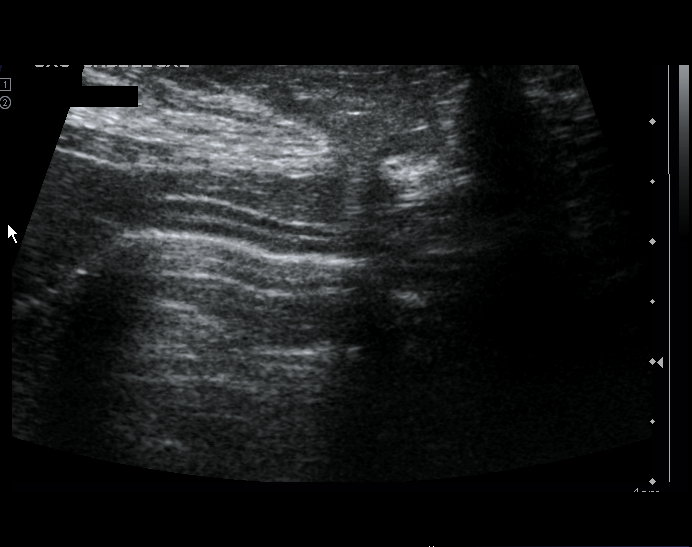
[im 7/12]
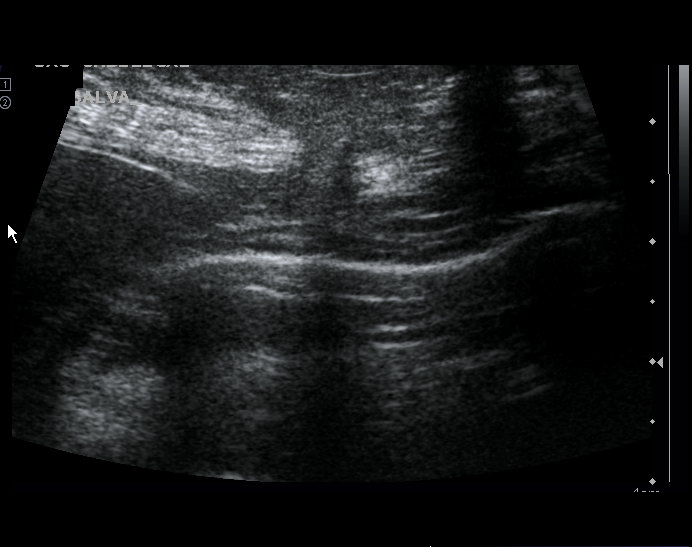
[im 8/12]
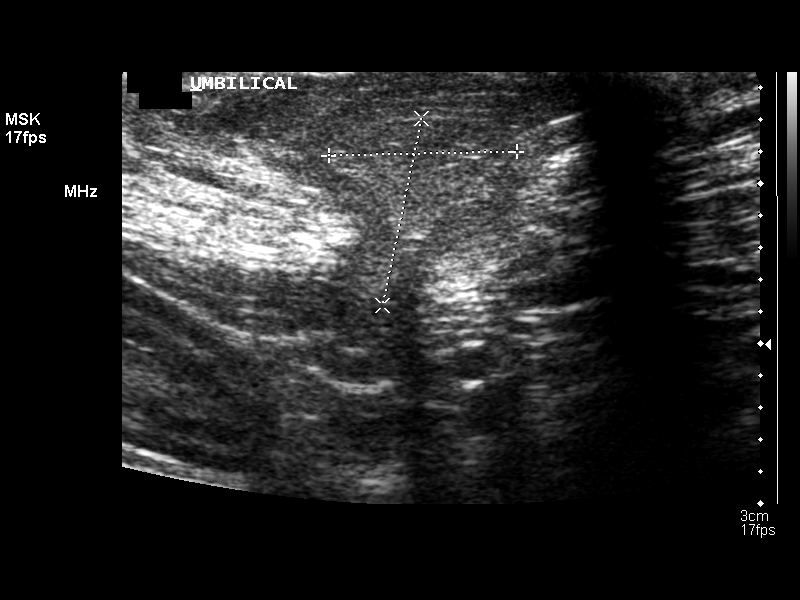
[im 9/12]
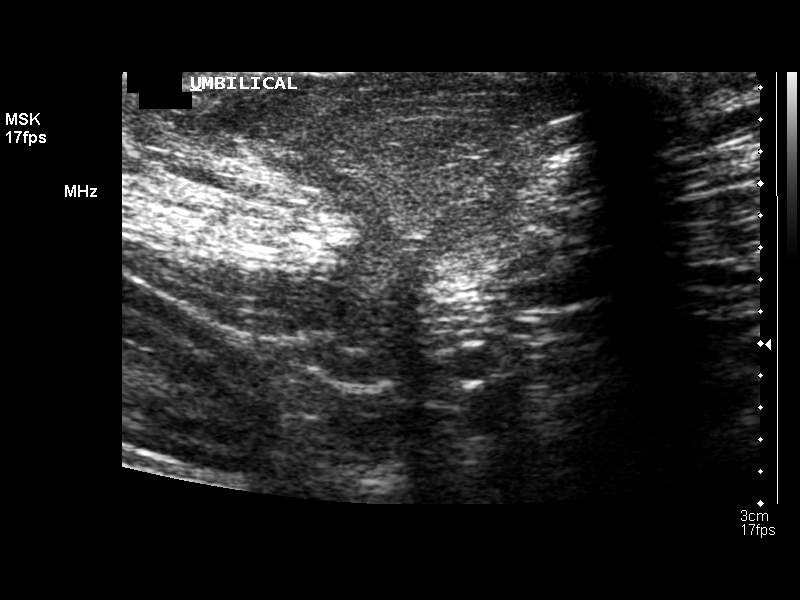
[im 10/12]
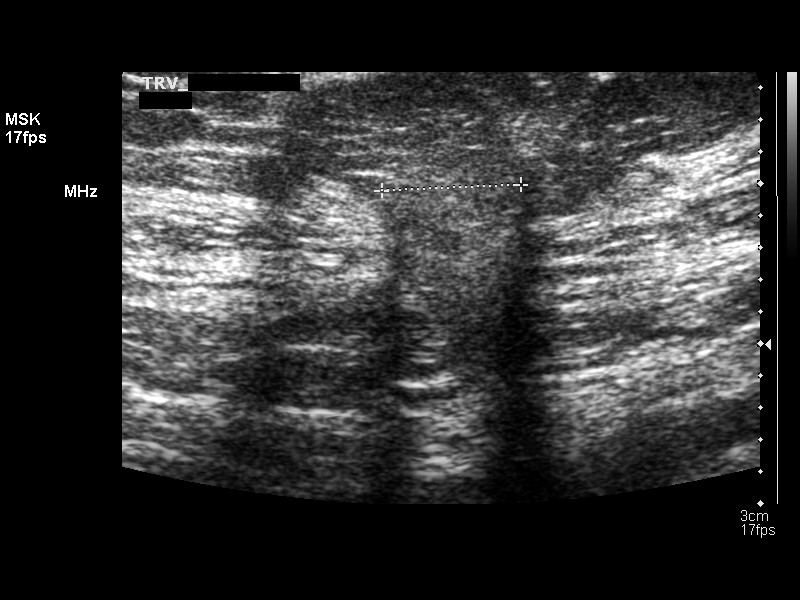
[im 11/12]
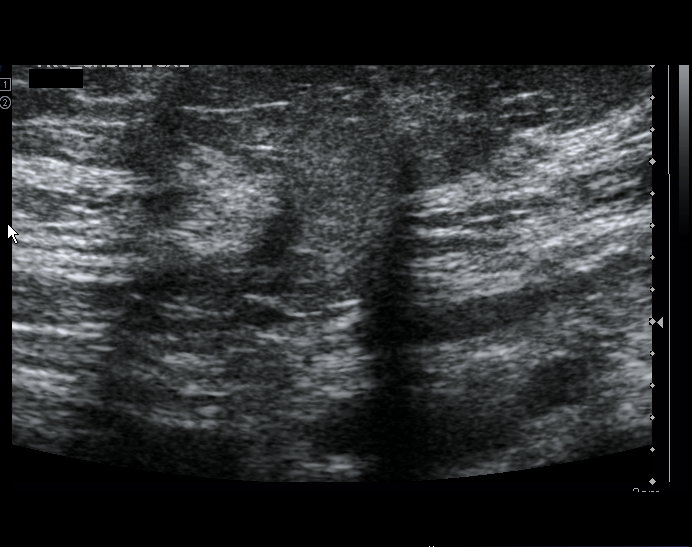
[im 12/12]
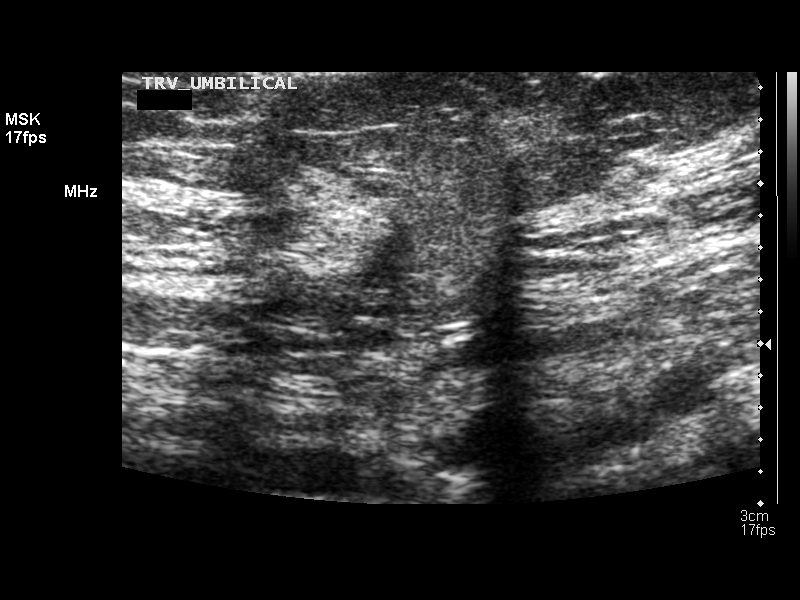

[12 of 12 positions shown; findings below may reference images not displayed]

FINDINGS: Uterus: The uterus has previously been resected.

Endometrium:Not applicable.

Right Ovary :The right ovary is not seen.

Left Ovary :The left ovary is not visualized.

Other Findings:  However, scanning over the area of fullness, there
is an apparent umbilical hernia with peristalsis.  No dilatation of
the bowel loop within the hernia is seen.
IMPRESSION: 1.  Umbilical hernia.
2.  Negative pelvic ultrasound status post hysterectomy.  No pelvic
mass or fluid.

## 2014-03-11 IMAGING — US US ABDOMEN COMPLETE
1 series · 14 of 25 positions shown · non-contrast
Comparison: CT abdomen pelvis of 04/06/2006

***ADDENDUM*** CREATED: 12/04/2011 [DATE]

Correction to the dictation from earlier today:
The impression should read :
"Negative ultrasound of the ABDOMEN.  No gallstones.  No ductal
dilatation."
***END ADDENDUM*** SIGNED BY: Abhinand Bd, M.D.
CLINICAL DATA: Abdominal and pelvic swelling
COMPLETE ABDOMINAL ULTRASOUND

[Series 1: us abdomen complete · 0.22mm/px · 14 of 85 slices shown]
[im 1/85]
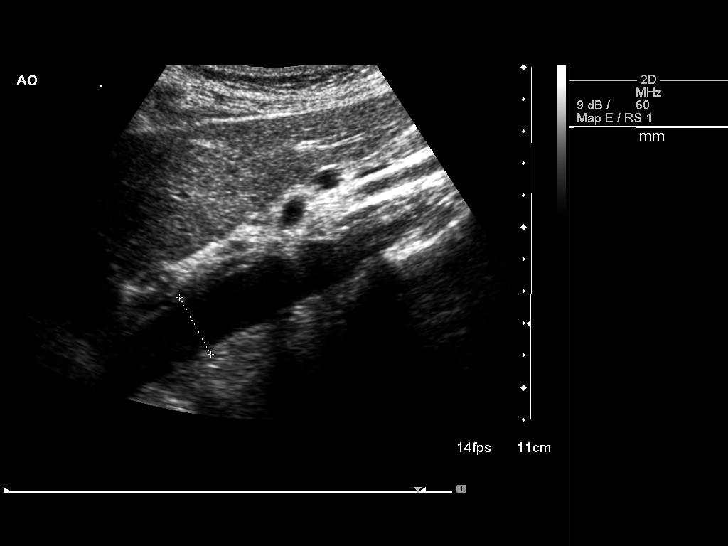
[im 8/85]
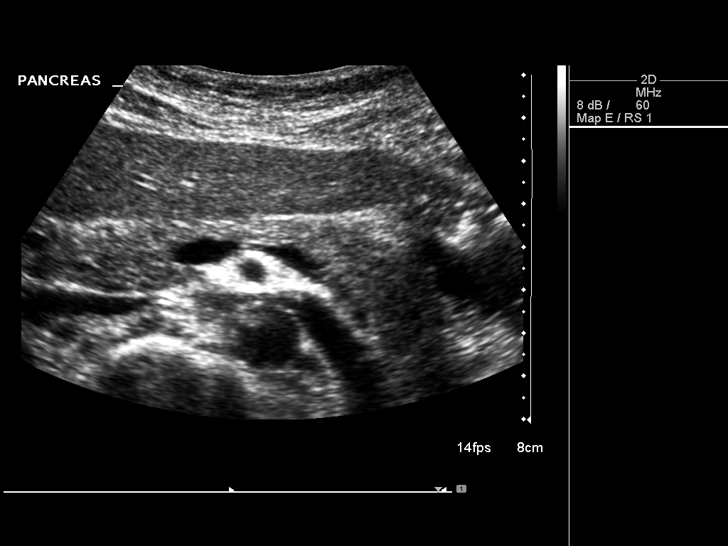
[im 15/85]
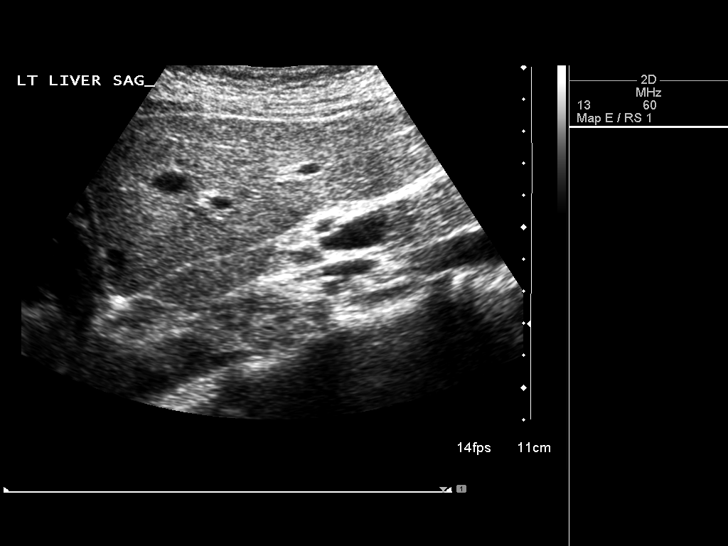
[im 22/85]
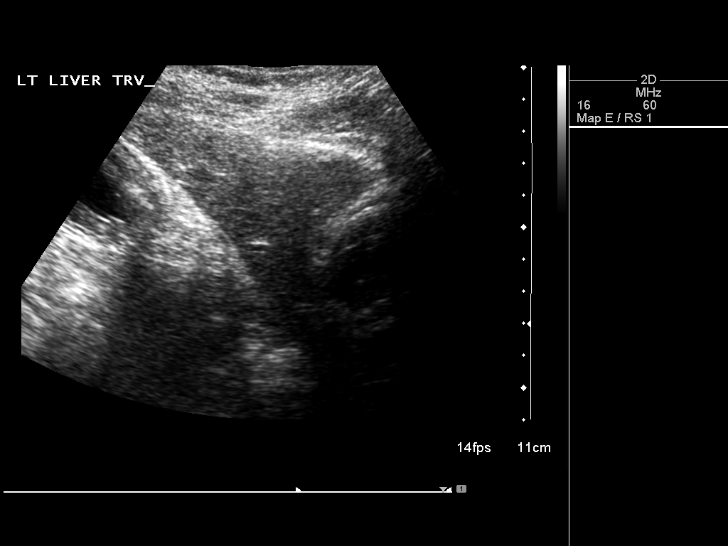
[im 29/85]
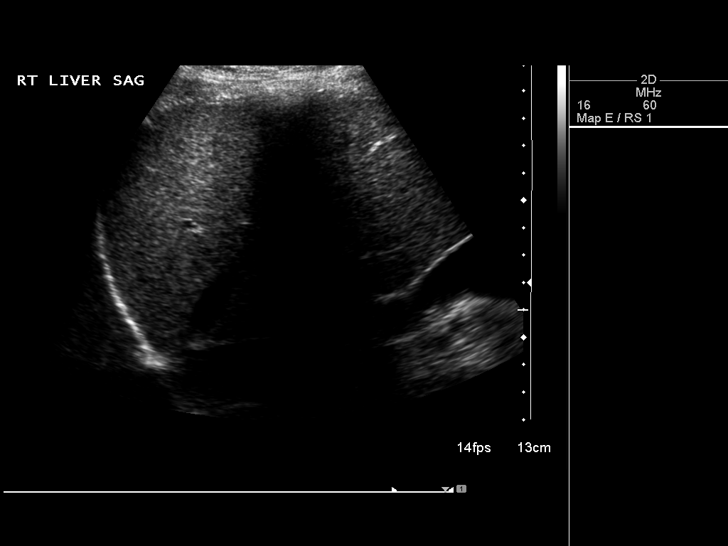
[im 32/85]
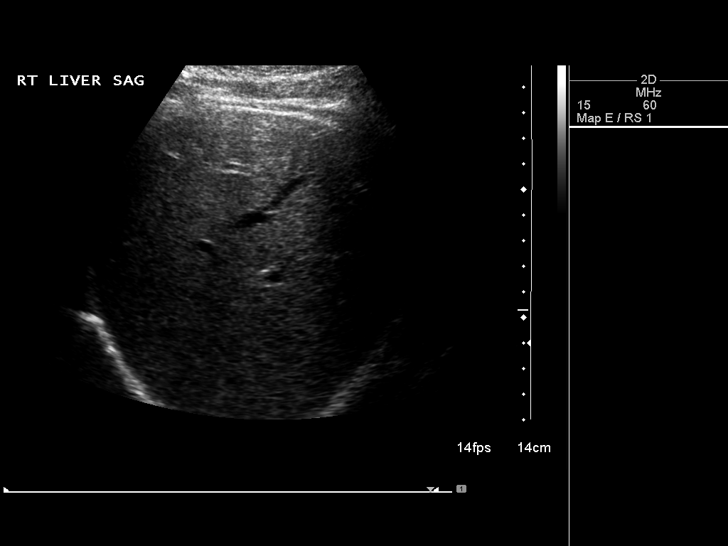
[im 39/85]
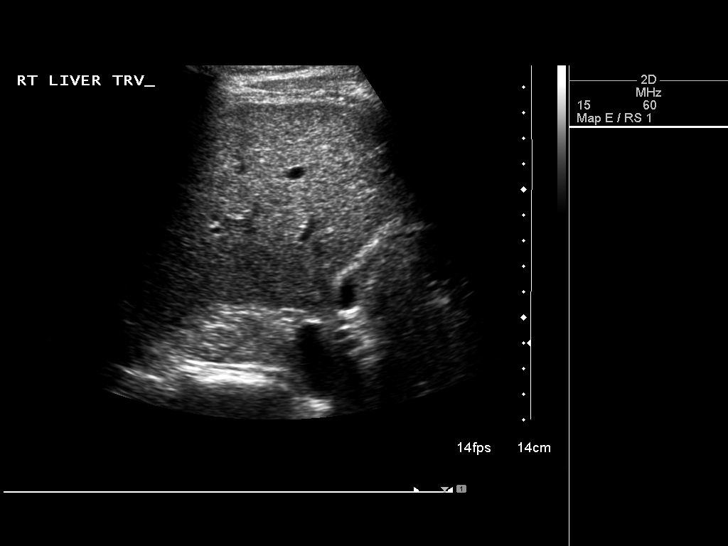
[im 46/85]
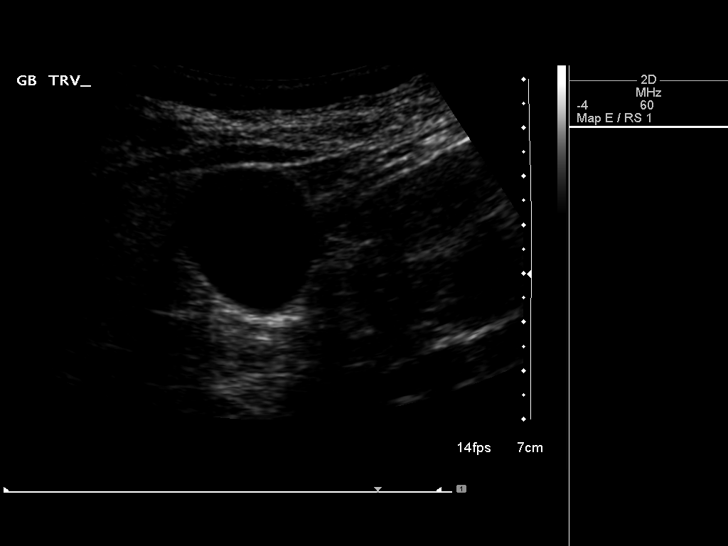
[im 53/85]
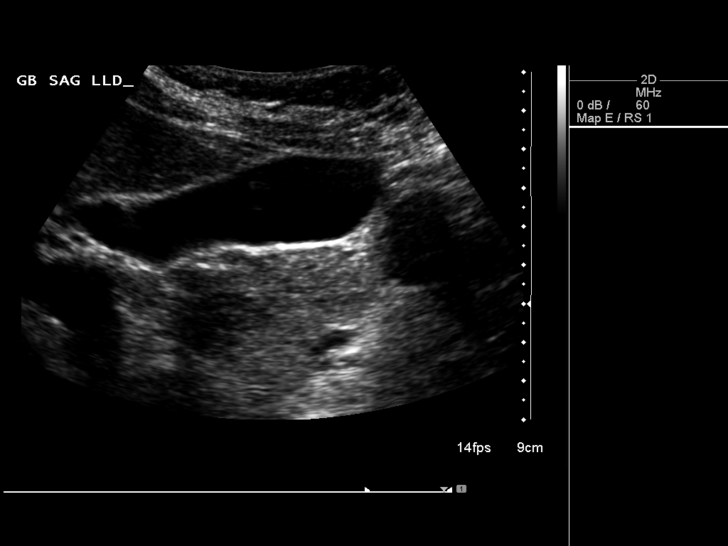
[im 57/85]
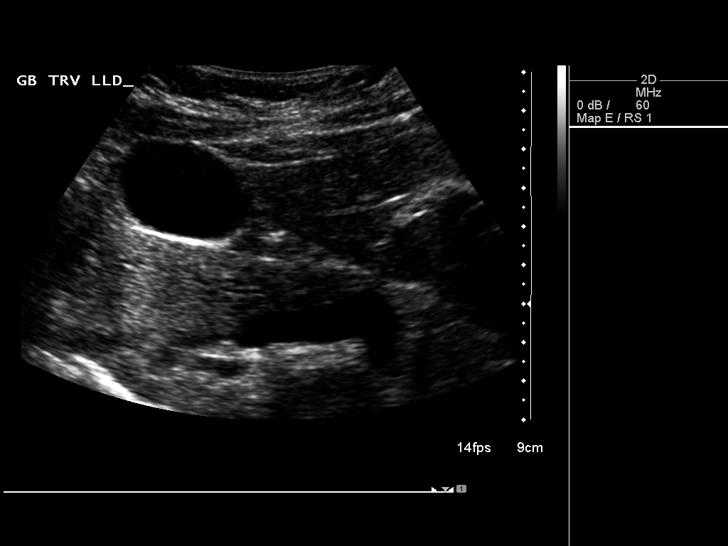
[im 64/85]
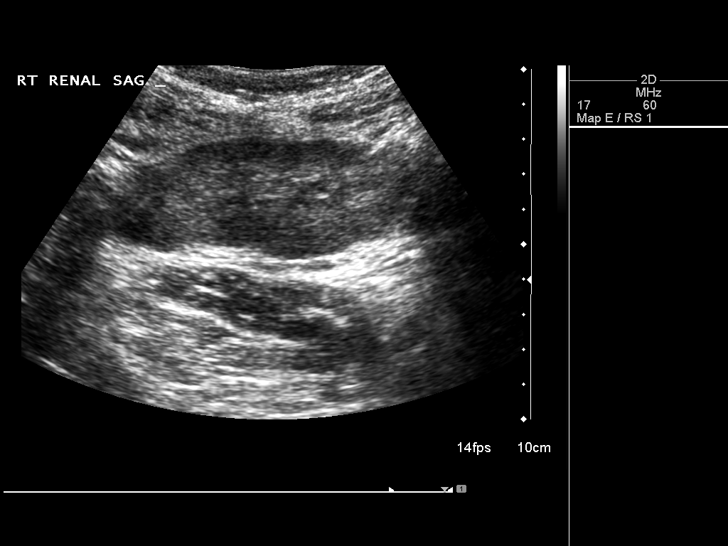
[im 71/85]
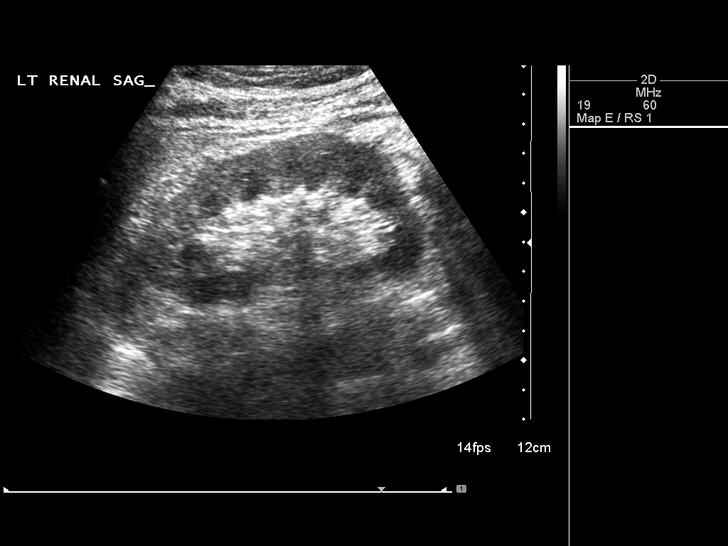
[im 78/85]
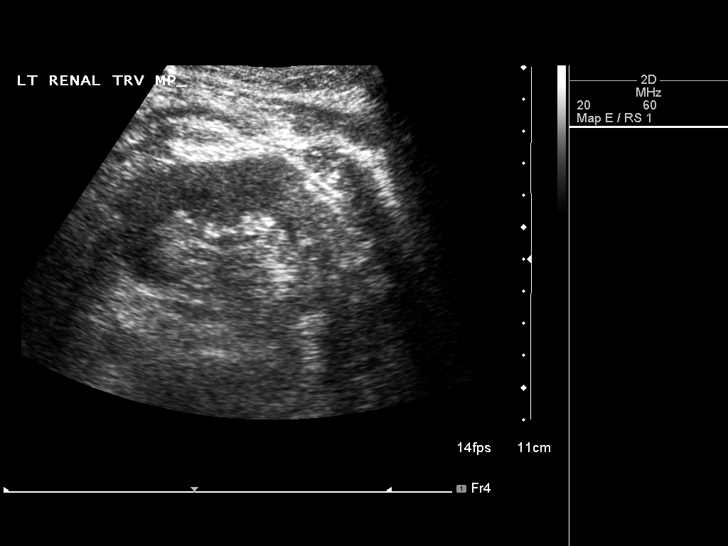
[im 85/85]
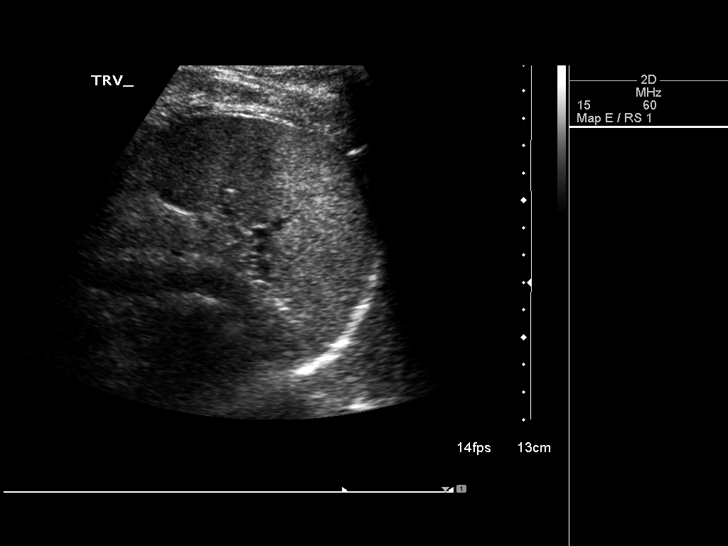

[14 of 25 positions shown; findings below may reference images not displayed]

FINDINGS: Gallbladder:  The gallbladder is visualized and no gallstones are
noted.  There is no pain over the gallbladder with compression.

Common bile duct:  The common bile duct is normal measuring 5.3 mm
in diameter.

Liver:  The liver has a normal echogenic pattern.  No ductal
dilatation is seen.

IVC:  Appears normal.

Pancreas:  The pancreas is moderately well visualized and the
pancreatic duct is not dilated.

Spleen:  The spleen is normal measuring 7.5 cm sagittally.

Right Kidney:  No hydronephrosis is seen.  The right kidney
measures 11.0 cm sagittally.

Left Kidney:  No hydronephrosis is noted.  The left kidney measures
10.7 cm.

Abdominal aorta:  The abdominal aorta is normal in caliber.
IMPRESSION: Negative pelvic ultrasound.  No gallstones.  No ductal dilatation.

## 2017-06-28 ENCOUNTER — Encounter: Attending: Internal Medicine | Primary: Internal Medicine

## 2017-08-11 ENCOUNTER — Ambulatory Visit
Admit: 2017-08-11 | Discharge: 2017-08-11 | Payer: PRIVATE HEALTH INSURANCE | Attending: Internal Medicine | Primary: Internal Medicine

## 2017-08-11 ENCOUNTER — Ambulatory Visit: Attending: Internal Medicine | Primary: Internal Medicine

## 2017-08-11 DIAGNOSIS — N942 Vaginismus: Secondary | ICD-10-CM

## 2017-08-11 NOTE — Progress Notes (Signed)
HISTORY OF PRESENT ILLNESS  Jennifer Poole is a 57 y.o. female.  HPI  New to me and this practice, comes in to get established.  Issues:  1. S/P hysterectomy for uterine fibroids.  Has seen Dr. Veverly Fells, on estradiol biweekly, but having trouble with vaginismus. Was referred for PT, but would like to try it at a different location.  2. Family history of colon cancer.  Had a colonoscopy in 2008 with one polyp, 2012 was negative.  Would like referral for another one. Not currently having bloody stools.  Has rare diarrhea.  Has been treated in the past for parasite, but not recently.  3. Umbilical hernia repair and occasional discomfort at site.  4. Breast cancer screening.  Due for mammogram.  Requests a 3D.    Social History:  She is from [place].  Daughter lives in [place].  Granddaughter Corrie Dandy is here and she cares for her twice a week.  She is working in Research officer, political party, previously was in [employer].  No tobacco, occasional alcohol.    Family History:  Father with coronary disease and peripheral arterial disease.  MGF had colon cancer and one cousin had colon cancer in his 30s.      Review of Systems   Constitutional: Negative for chills, fever and weight loss.   Respiratory: Negative for cough, shortness of breath and wheezing.    Cardiovascular: Negative for chest pain, palpitations, orthopnea, leg swelling and PND.   Gastrointestinal: Positive for diarrhea. Negative for abdominal pain, blood in stool, constipation, heartburn, melena and nausea.   Genitourinary: Negative for dysuria.   Musculoskeletal: Positive for back pain. Negative for myalgias.   Neurological: Negative for dizziness and headaches.   All other systems reviewed and are negative.      Physical Exam   Constitutional: She is oriented to person, place, and time. She appears well-developed and well-nourished.   HENT:   Head: Normocephalic and atraumatic.   Right Ear: Tympanic membrane, external ear and ear canal normal.    Left Ear: Tympanic membrane, external ear and ear canal normal.   Nose: Nose normal.   Mouth/Throat: Oropharynx is clear and moist and mucous membranes are normal. No oropharyngeal exudate.   Eyes: Pupils are equal, round, and reactive to light. Conjunctivae are normal. Right eye exhibits no discharge. Left eye exhibits no discharge.   Neck: Normal range of motion. Neck supple. Carotid bruit is not present. No thyromegaly present.   Cardiovascular: Normal rate, regular rhythm, S1 normal, S2 normal, normal heart sounds and intact distal pulses.   No murmur heard.  Pulmonary/Chest: Effort normal and breath sounds normal. No respiratory distress. She has no wheezes. She has no rales.   Abdominal: Soft. Bowel sounds are normal. She exhibits no distension and no mass. There is no tenderness.   Healed upper transverse scar   Musculoskeletal: She exhibits no edema.   Lymphadenopathy:     She has no cervical adenopathy.   Neurological: She is alert and oriented to person, place, and time.   Skin: No rash noted.   Psychiatric: She has a normal mood and affect. Her behavior is normal.   Nursing note and vitals reviewed.      ASSESSMENT and PLAN  Diagnoses and all orders for this visit:    1. Vaginismus  -     REFERRAL TO PHYSICAL THERAPY    2. S/P hysterectomy-cont on vivelle from dr knapp    3. Hx of colonoscopy    4. FH: colon cancer  -  REFERRAL TO GASTROENTEROLOGY    5. Breast cancer screening  -     MAM 3D TOMO W MAMMO BI SCREENING INCL CAD; Future  Labs done last fall will get records  Welcomed to office

## 2017-08-11 NOTE — Progress Notes (Signed)
HISTORY OF PRESENT ILLNESS  Jennifer Poole is a 57 y.o. female.  HPI  New to me and this practice, comes in to get established.  Issues:  1. S/P hysterectomy for uterine fibroids.  Has seen Dr. Veverly Fells, on estradiol biweekly, but having trouble with vaginismus. Was referred for PT, but would like to try it at a different location.  2. Family history of colon cancer.  Had a colonoscopy in 2008 with one polyp, 2012 was negative.  Would like referral for another one. Not currently having bloody stools.  Has rare diarrhea.  Has been treated in the past for parasite, but not recently.  3. Umbilical hernia repair and occasional discomfort at site.  4. Breast cancer screening.  Due for mammogram.  Requests a 3D.    Social History:  She is from [place].  Daughter lives in [place].  Granddaughter Corrie Dandy is here and she cares for her twice a week.  She is working in Research officer, political party, previously was in Photographer.  No tobacco, occasional alcohol.    Family History:  Father with coronary disease and peripheral arterial disease.  MGF had colon cancer and one cousin had colon cancer in his 30s.      Review of Systems   Constitutional: Negative for chills, fever and weight loss.   Respiratory: Negative for cough, shortness of breath and wheezing.    Cardiovascular: Negative for chest pain, palpitations, orthopnea, leg swelling and PND.   Gastrointestinal: Positive for diarrhea. Negative for abdominal pain, blood in stool, constipation, heartburn, melena and nausea.   Genitourinary: Negative for dysuria.   Musculoskeletal: Positive for back pain. Negative for myalgias.   Neurological: Negative for dizziness and headaches.   All other systems reviewed and are negative.      Physical Exam   Constitutional: She is oriented to person, place, and time. She appears well-developed and well-nourished.   HENT:   Head: Normocephalic and atraumatic.   Right Ear: Tympanic membrane, external ear and ear canal normal.   Left Ear: Tympanic membrane,  external ear and ear canal normal.   Nose: Nose normal.   Mouth/Throat: Oropharynx is clear and moist and mucous membranes are normal. No oropharyngeal exudate.   Eyes: Pupils are equal, round, and reactive to light. Conjunctivae are normal. Right eye exhibits no discharge. Left eye exhibits no discharge.   Neck: Normal range of motion. Neck supple. Carotid bruit is not present. No thyromegaly present.   Cardiovascular: Normal rate, regular rhythm, S1 normal, S2 normal, normal heart sounds and intact distal pulses.   No murmur heard.  Pulmonary/Chest: Effort normal and breath sounds normal. No respiratory distress. She has no wheezes. She has no rales.   Abdominal: Soft. Bowel sounds are normal. She exhibits no distension and no mass. There is no tenderness.   Healed upper transverse scar   Musculoskeletal: She exhibits no edema.   Lymphadenopathy:     She has no cervical adenopathy.   Neurological: She is alert and oriented to person, place, and time.   Skin: No rash noted.   Psychiatric: She has a normal mood and affect. Her behavior is normal.   Nursing note and vitals reviewed.      ASSESSMENT and PLAN  Diagnoses and all orders for this visit:    1. Vaginismus  -     REFERRAL TO PHYSICAL THERAPY    2. S/P hysterectomy-cont on vivelle from dr knapp    3. Hx of colonoscopy    4. FH: colon cancer  -  REFERRAL TO GASTROENTEROLOGY    5. Breast cancer screening  -     MAM 3D TOMO W MAMMO BI SCREENING INCL CAD; Future  Labs done last fall will get records  Welcomed to office

## 2017-10-05 ENCOUNTER — Encounter

## 2017-12-09 ENCOUNTER — Ambulatory Visit: Payer: PRIVATE HEALTH INSURANCE | Primary: Internal Medicine

## 2018-01-12 ENCOUNTER — Ambulatory Visit
Admit: 2018-01-12 | Discharge: 2018-01-12 | Payer: PRIVATE HEALTH INSURANCE | Attending: Internal Medicine | Primary: Internal Medicine

## 2018-01-12 ENCOUNTER — Ambulatory Visit: Attending: Internal Medicine | Primary: Internal Medicine

## 2018-01-12 DIAGNOSIS — R1032 Left lower quadrant pain: Secondary | ICD-10-CM

## 2018-01-12 NOTE — Progress Notes (Signed)
Notified by letter.  Vit d 2000

## 2018-01-12 NOTE — Progress Notes (Signed)
HISTORY OF PRESENT ILLNESS  Jennifer Poole is a 57 y.o. female.  HPI  Jennifer Poole had an episode of abdominal pain, lasted about a week, has mostly improved in the last 48 hours, described as a burning, sharp pain, worse when rolling to one side.  No nausea, vomiting, fevers, chills, diarrhea or bloody stools.  She had a colonoscopy within the last year, which showed diverticula, but nothing else. She does lift her 13 month old granddaughter, Jennifer Poole, and wonders if she pulled muscles.  Also has had abdominal surgery with hysterectomy and hernia repair and wonders about scar tissue.  Interested in flu shot today. Notes vitamin D levels low in the past.      Review of Systems   Constitutional: Negative for chills, fever and weight loss.   Respiratory: Negative for cough, shortness of breath and wheezing.    Cardiovascular: Negative for chest pain, palpitations, orthopnea, leg swelling and PND.   Gastrointestinal: Positive for abdominal pain. Negative for blood in stool, constipation, diarrhea, heartburn, melena, nausea and vomiting.   Genitourinary: Negative for dysuria, flank pain, frequency, hematuria and urgency.   Musculoskeletal: Negative for myalgias.   Neurological: Negative for dizziness and headaches.       Physical Exam   Constitutional: She is oriented to person, place, and time. She appears well-developed and well-nourished.   HENT:   Head: Normocephalic and atraumatic.   Neck: Normal range of motion. Neck supple. Carotid bruit is not present. No thyromegaly present.   Cardiovascular: Normal rate, regular rhythm, S1 normal, S2 normal, normal heart sounds and intact distal pulses.   No murmur heard.  Pulmonary/Chest: Effort normal and breath sounds normal. No respiratory distress. She has no wheezes. She has no rales.   Abdominal: Soft. Bowel sounds are normal. She exhibits no distension and no mass. There is tenderness (mild in llq healed transverse scar abd above umbilicus). There is no guarding.    Musculoskeletal: She exhibits no edema.   Neurological: She is alert and oriented to person, place, and time.   Skin: No rash noted.   Psychiatric: She has a normal mood and affect. Her behavior is normal.   Nursing note and vitals reviewed.      ASSESSMENT and PLAN  Diagnoses and all orders for this visit:    1. LLQ abdominal pain  -     CT ABD PELV W CONT; Future  -     METABOLIC PANEL, COMPREHENSIVE  -     CBC WITH AUTOMATED DIFF    2. S/P hysterectomy  May have scar tissue and if recurrent pain with normal ct and colonoscopy would send for surgical eval  3. Vitamin deficiency  -     LIPID PANEL  -     VITAMIN D, 25 HYDROXY    4. Hx of colonoscopy-normal other than diverticulosis

## 2018-01-12 NOTE — Progress Notes (Signed)
HISTORY OF PRESENT ILLNESS  Jennifer Poole is a 57 y.o. female.  HPI  Jennifer Poole had an episode of abdominal pain, lasted about a week, has mostly improved in the last 48 hours, described as a burning, sharp pain, worse when rolling to one side.  No nausea, vomiting, fevers, chills, diarrhea or bloody stools.  She had a colonoscopy within the last year, which showed diverticula, but nothing else. She does lift her 27 month old granddaughter, Jennifer Poole, and wonders if she pulled muscles.  Also has had abdominal surgery with hysterectomy and hernia repair and wonders about scar tissue.  Interested in flu shot today. Notes vitamin D levels low in the past.      Review of Systems   Constitutional: Negative for chills, fever and weight loss.   Respiratory: Negative for cough, shortness of breath and wheezing.    Cardiovascular: Negative for chest pain, palpitations, orthopnea, leg swelling and PND.   Gastrointestinal: Positive for abdominal pain. Negative for blood in stool, constipation, diarrhea, heartburn, melena, nausea and vomiting.   Genitourinary: Negative for dysuria, flank pain, frequency, hematuria and urgency.   Musculoskeletal: Negative for myalgias.   Neurological: Negative for dizziness and headaches.       Physical Exam   Constitutional: She is oriented to person, place, and time. She appears well-developed and well-nourished.   HENT:   Head: Normocephalic and atraumatic.   Neck: Normal range of motion. Neck supple. Carotid bruit is not present. No thyromegaly present.   Cardiovascular: Normal rate, regular rhythm, S1 normal, S2 normal, normal heart sounds and intact distal pulses.   No murmur heard.  Pulmonary/Chest: Effort normal and breath sounds normal. No respiratory distress. She has no wheezes. She has no rales.   Abdominal: Soft. Bowel sounds are normal. She exhibits no distension and no mass. There is tenderness (mild in llq healed transverse scar abd above umbilicus). There is no guarding.    Musculoskeletal: She exhibits no edema.   Neurological: She is alert and oriented to person, place, and time.   Skin: No rash noted.   Psychiatric: She has a normal mood and affect. Her behavior is normal.   Nursing note and vitals reviewed.      ASSESSMENT and PLAN  Diagnoses and all orders for this visit:    1. LLQ abdominal pain  -     CT ABD PELV W CONT; Future  -     METABOLIC PANEL, COMPREHENSIVE  -     CBC WITH AUTOMATED DIFF    2. S/P hysterectomy  May have scar tissue and if recurrent pain with normal ct and colonoscopy would send for surgical eval  3. Vitamin deficiency  -     LIPID PANEL  -     VITAMIN D, 25 HYDROXY    4. Hx of colonoscopy-normal other than diverticulosis

## 2018-01-20 LAB — METABOLIC PANEL, COMPREHENSIVE
A-G Ratio: 1.7 (ref 1.2–2.2)
ALT (SGPT): 14 IU/L (ref 0–32)
AST (SGOT): 24 IU/L (ref 0–40)
Albumin: 4.8 g/dL (ref 3.5–5.5)
Alk. phosphatase: 71 IU/L (ref 39–117)
BUN/Creatinine ratio: 23 (ref 9–23)
BUN: 19 mg/dL (ref 6–24)
Bilirubin, total: 0.5 mg/dL (ref 0.0–1.2)
CO2: 29 mmol/L (ref 20–29)
Calcium: 9.4 mg/dL (ref 8.7–10.2)
Chloride: 100 mmol/L (ref 96–106)
Creatinine: 0.83 mg/dL (ref 0.57–1.00)
GFR est AA: 92 mL/min/{1.73_m2} (ref >59)
GFR est non-AA: 80 mL/min/{1.73_m2} (ref >59)
GLOBULIN, TOTAL: 2.8 g/dL (ref 1.5–4.5)
Glucose: 92 mg/dL (ref 65–99)
Potassium: 4.5 mmol/L (ref 3.5–5.2)
Protein, total: 7.6 g/dL (ref 6.0–8.5)
Sodium: 142 mmol/L (ref 134–144)

## 2018-01-20 LAB — CBC WITH AUTOMATED DIFF
ABS. BASOPHILS: 0 10*3/uL (ref 0.0–0.2)
ABS. EOSINOPHILS: 0.1 10*3/uL (ref 0.0–0.4)
ABS. IMM. GRANS.: 0 10*3/uL (ref 0.0–0.1)
ABS. MONOCYTES: 0.4 10*3/uL (ref 0.1–0.9)
ABS. NEUTROPHILS: 2.4 10*3/uL (ref 1.4–7.0)
Abs Lymphocytes: 2.2 10*3/uL (ref 0.7–3.1)
BASOPHILS: 1 %
EOSINOPHILS: 1 %
HCT: 41.7 % (ref 34.0–46.6)
HGB: 14.1 g/dL (ref 11.1–15.9)
IMMATURE GRANULOCYTES: 0 %
Lymphocytes: 43 %
MCH: 30.6 pg (ref 26.6–33.0)
MCHC: 33.8 g/dL (ref 31.5–35.7)
MCV: 91 fL (ref 79–97)
MONOCYTES: 8 %
NEUTROPHILS: 47 %
PLATELET: 211 10*3/uL (ref 150–450)
RBC: 4.61 x10E6/uL (ref 3.77–5.28)
RDW: 12.8 % (ref 12.3–15.4)
WBC: 5.1 10*3/uL (ref 3.4–10.8)

## 2018-01-20 LAB — LIPID PANEL
Cholesterol, Total: 230 mg/dL — ABNORMAL HIGH (ref 100–199)
Cholesterol, total: 230 mg/dL — ABNORMAL HIGH (ref 100–199)
HDL Cholesterol: 80 mg/dL (ref >39)
HDL: 80 mg/dL (ref >39)
LDL Calculated: 139 mg/dL — ABNORMAL HIGH (ref 0–99)
LDL, calculated: 139 mg/dL — ABNORMAL HIGH (ref 0–99)
Triglyceride: 57 mg/dL (ref 0–149)
Triglycerides: 57 mg/dL (ref 0–149)
VLDL Cholesterol Calculated: 11 mg/dL (ref 5–40)
VLDL, calculated: 11 mg/dL (ref 5–40)

## 2018-01-20 LAB — CVD REPORT

## 2018-01-20 LAB — VITAMIN D, 25 HYDROXY: VITAMIN D, 25-HYDROXY: 24.8 ng/mL — ABNORMAL LOW (ref 30.0–100.0)

## 2018-01-20 LAB — COMPREHENSIVE METABOLIC PANEL
ALT: 14 IU/L (ref 0–32)
AST: 24 IU/L (ref 0–40)
Albumin/Globulin Ratio: 1.7 NA (ref 1.2–2.2)
Albumin: 4.8 g/dL (ref 3.5–5.5)
Alkaline Phosphatase: 71 IU/L (ref 39–117)
BUN: 19 mg/dL (ref 6–24)
Bun/Cre Ratio: 23 NA (ref 9–23)
CO2: 29 mmol/L (ref 20–29)
Calcium: 9.4 mg/dL (ref 8.7–10.2)
Chloride: 100 mmol/L (ref 96–106)
Creatinine: 0.83 mg/dL (ref 0.57–1.00)
EGFR IF NonAfrican American: 80 mL/min/{1.73_m2} (ref >59)
GFR African American: 92 mL/min/{1.73_m2} (ref >59)
Globulin, Total: 2.8 g/dL (ref 1.5–4.5)
Glucose: 92 mg/dL (ref 65–99)
Potassium: 4.5 mmol/L (ref 3.5–5.2)
Sodium: 142 mmol/L (ref 134–144)
Total Bilirubin: 0.5 mg/dL (ref 0.0–1.2)
Total Protein: 7.6 g/dL (ref 6.0–8.5)

## 2018-01-20 LAB — CBC WITH AUTO DIFFERENTIAL
Basophils %: 1 %
Basophils Absolute: 0 10*3/uL (ref 0.0–0.2)
Eosinophils %: 1 %
Eosinophils Absolute: 0.1 10*3/uL (ref 0.0–0.4)
Granulocyte Absolute Count: 0 10*3/uL (ref 0.0–0.1)
Hematocrit: 41.7 % (ref 34.0–46.6)
Hemoglobin: 14.1 g/dL (ref 11.1–15.9)
Immature Granulocytes: 0 %
Lymphocytes %: 43 %
Lymphocytes Absolute: 2.2 10*3/uL (ref 0.7–3.1)
MCH: 30.6 pg (ref 26.6–33.0)
MCHC: 33.8 g/dL (ref 31.5–35.7)
MCV: 91 fL (ref 79–97)
Monocytes %: 8 %
Monocytes Absolute: 0.4 10*3/uL (ref 0.1–0.9)
Neutrophils %: 47 %
Neutrophils Absolute: 2.4 10*3/uL (ref 1.4–7.0)
Platelets: 211 10*3/uL (ref 150–450)
RBC: 4.61 x10E6/uL (ref 3.77–5.28)
RDW: 12.8 % (ref 12.3–15.4)
WBC: 5.1 10*3/uL (ref 3.4–10.8)

## 2018-01-20 LAB — VITAMIN D 25 HYDROXY: Vit D, 25-Hydroxy: 24.8 ng/mL — ABNORMAL LOW (ref 30.0–100.0)

## 2018-01-25 NOTE — Telephone Encounter (Signed)
From: Princella IonAthina Hesketh  Sent: 01/24/2018 5:32 PM EST  Subject: Prescription Question    I got a call from the coordinator just now so please ignore my previous message.   I set up an appointment for Nov 20.

## 2018-01-25 NOTE — Telephone Encounter (Signed)
From: Princella IonAthina Favata  Sent: 01/24/2018 4:47 PM EST  Subject: Prescription Question    I received a phone call from an imaging center for the CT scan you requested.  I called them Nov 6 and left a message. I left another message this morning and haven???t heard back.     Can you let me know where this imaging center is? Perhaps I can go in-person tomorrow to talk to them. I will be out of town most of November and was hoping  to have it done this month. If not maybe if it is not too late I can try Dec or Jan. Thank you.     Thank you.

## 2018-01-31 ENCOUNTER — Ambulatory Visit: Payer: PRIVATE HEALTH INSURANCE | Primary: Internal Medicine

## 2018-01-31 ENCOUNTER — Inpatient Hospital Stay: Admit: 2018-01-31 | Payer: PRIVATE HEALTH INSURANCE | Attending: Internal Medicine | Primary: Internal Medicine

## 2018-01-31 DIAGNOSIS — Z1231 Encounter for screening mammogram for malignant neoplasm of breast: Secondary | ICD-10-CM

## 2018-01-31 NOTE — Progress Notes (Signed)
Message sent about labs

## 2018-01-31 NOTE — Telephone Encounter (Signed)
From: Princella IonAthina Friesenhahn  To: Clarene CritchleyGlynn, Francesca L, MD  Sent: 01/31/2018 4:21 PM EST  Subject: Referral Request    Hello Dr Darl PikesGlynn,     I have a couple of questions.    [facility]???m [facility]. 3[facility] CT scan of abdomen/pelvis with contrast and physician review (in network)    -they asked if I need to take the oral solution or intravenous dye or both?    Next question:    -I may cancel the above  appt if it is okay to go to the facility (below) because this one is half the out of pocket cost even without insurance! If it is OK, the facility would need your physician referral by fax:    [facility]   [address]  [place], [place], [id number]  Phone [phone]  Fax [phone]    Please let me know if you have any concerns about my going to this facility vs the one in [facility] through my insurance.

## 2018-02-01 ENCOUNTER — Encounter

## 2018-02-02 ENCOUNTER — Ambulatory Visit: Payer: PRIVATE HEALTH INSURANCE | Primary: Internal Medicine

## 2018-02-03 ENCOUNTER — Inpatient Hospital Stay: Payer: PRIVATE HEALTH INSURANCE | Attending: Internal Medicine | Primary: Internal Medicine

## 2018-02-14 NOTE — Telephone Encounter (Signed)
From: Princella IonAthina Orlov  To: Clarene CritchleyFrancesca L Glynn, MD  Sent: 02/14/2018 1:38 PM EST  Subject: Test Results Question    Have you received the faxed report from my Abdomen/Pelvic CT scan with Contrast?    If not, I will follow up with [facility] Tel: [phone]    I also have a copy of the burned CD with images. Let me know if I should drop it off to you (I can bring it by today).    Scan  was on evening of 02/03/18 and report was expected by the 26-27th.

## 2018-03-07 NOTE — Telephone Encounter (Signed)
From: Zelphia CairoMarion B Yona Stansbury, LPN  To: Princella IonAthina Kolle  Sent: 02/18/2018 4:36 PM EST  Subject: CT results    Hi Elfie we finally received your CT results. Dr.Glynn reviewed everything & there was nothing seen to cause the abdominal pain. There was a comment of questionable bladder wall thickening but there is nothing we can confirm at this point as even the radiologist stated "questionable". Dr.Glynn suggests seeing a urologist just out of caution to follow up on this finding as they are the experts in that dept. She refers [facility]. She reassures this can wait until after the holiday. If you do get an appt please let us know so we know who to fax a copy of the report to. If you have questions please do not hesitate to ask. Have a good weekend.    [place], LPN.

## 2021-02-05 NOTE — Telephone Encounter (Signed)
-----   Message from [place] sent at 02/05/2021  9:58 AM EST -----  Subject: Appointment Request    Reason for Call: Established Patient Appointment needed: Routine Physical   Exam    QUESTIONS    Reason for appointment request? No appointments available during search     Additional Information for Provider? PT called in to rebook her physical   for 02/17/21 No appointments available during search. Please follow up for   PT needs.   ---------------------------------------------------------------------------  --------------  Jennifer Poole INFO  [phone]; OK to leave message on voicemail  ---------------------------------------------------------------------------  --------------  SCRIPT ANSWERS  COVID Screen: [place]

## 2021-02-05 NOTE — Telephone Encounter (Signed)
PSR called and lvm asking patient for a call back so that she can be rescheduled. PSR also informed patient if she reschedules the appointment the next available appointment for a physical is not until 5/24 of next year.

## 2021-02-17 ENCOUNTER — Encounter: Attending: Internal Medicine | Primary: Internal Medicine

## 2021-08-06 ENCOUNTER — Encounter: Payer: PRIVATE HEALTH INSURANCE | Attending: Internal Medicine | Primary: Internal Medicine

## 2021-08-06 ENCOUNTER — Encounter: Attending: Internal Medicine | Primary: Internal Medicine
